# Patient Record
Sex: Female | Born: 1985 | Race: Black or African American | Hispanic: No | Marital: Single | Smoking: Never smoker
Health system: Southern US, Community
[De-identification: ages and names within clinical notes are randomized; demographics above are authoritative.]

## PROBLEM LIST (undated history)

## (undated) DIAGNOSIS — I10 Essential (primary) hypertension: Secondary | ICD-10-CM

---

## 2005-10-23 ENCOUNTER — Emergency Department (HOSPITAL_COMMUNITY): Admission: EM | Admit: 2005-10-23 | Discharge: 2005-10-23 | Payer: Self-pay | Admitting: Emergency Medicine

## 2005-10-28 ENCOUNTER — Emergency Department (HOSPITAL_COMMUNITY): Admission: EM | Admit: 2005-10-28 | Discharge: 2005-10-28 | Payer: Self-pay | Admitting: Emergency Medicine

## 2011-06-17 ENCOUNTER — Emergency Department (HOSPITAL_COMMUNITY)
Admission: EM | Admit: 2011-06-17 | Discharge: 2011-06-17 | Disposition: A | Discharge status: Home / Self Care | Payer: PRIVATE HEALTH INSURANCE | Source: Home / Self Care | Attending: Emergency Medicine | Admitting: Emergency Medicine

## 2011-06-17 DIAGNOSIS — R05 Cough: Secondary | ICD-10-CM

## 2011-06-17 HISTORY — DX: Essential (primary) hypertension: I10

## 2011-06-17 MED ORDER — GUAIFENESIN-CODEINE 100-10 MG/5ML PO SYRP: 5.0000 mL | ORAL_SOLUTION | Freq: Four times a day (QID) | ORAL | Status: AC | PRN

## 2011-06-17 MED ORDER — ALBUTEROL SULFATE HFA 108 (90 BASE) MCG/ACT IN AERS: 1.0000 | INHALATION_SPRAY | Freq: Four times a day (QID) | RESPIRATORY_TRACT | Status: DC | PRN

## 2011-06-17 MED ORDER — IBUPROFEN 600 MG PO TABS: 600.0000 mg | ORAL_TABLET | Freq: Four times a day (QID) | ORAL | Status: AC | PRN

## 2011-06-17 NOTE — ED Provider Notes (Signed)
History     CSN: [id number] Arrival date & time: 06/17/2011 10:15 AM   First MD Initiated Contact with Patient 06/17/11 1006      Chief Complaint  Patient presents with  . Cough    HPI Comments: Pt with dry nonproductive cough with wheezing. Achy CP after coughing. Unable to sleep at night secondary to coughing. Pt's children with similar sx last week. No other URI like sx. No fevers, SOB, abd pain, post tussive emesis.   Patient is a 25 y.o. female presenting with cough. The history is provided by the patient.  Cough This is a new problem. The current episode started more than 2 days ago. The problem has not changed since onset.The cough is non-productive. There has been no fever. Associated symptoms include wheezing. Pertinent negatives include no chest pain, no chills, no sweats, no weight loss, no headaches, no rhinorrhea, no sore throat, no myalgias and no shortness of breath. She has tried cough syrup for the symptoms. The treatment provided no relief. She is not a smoker. Her past medical history does not include bronchitis, pneumonia or asthma.    Past Medical History  Diagnosis Date  . Hypertension     History reviewed. No pertinent past surgical history.  History reviewed. No pertinent family history.  History  Substance Use Topics  . Smoking status: Never Smoker   . Smokeless tobacco: Not on file  . Alcohol Use: No    OB History    Grav Para Term Preterm Abortions TAB SAB Ect Mult Living                  Review of Systems  Constitutional: Negative for fever, chills and weight loss.  HENT: Negative for sore throat, rhinorrhea and sneezing.   Respiratory: Positive for cough and wheezing. Negative for shortness of breath.   Cardiovascular: Negative for chest pain.  Gastrointestinal: Negative for nausea and vomiting.  Musculoskeletal: Negative for myalgias.  Neurological: Negative for weakness and headaches.  Psychiatric/Behavioral: The patient is hyperactive.      Allergies  Review of patient's allergies indicates no known allergies.  Home Medications   Current Outpatient Rx  Name Route Sig Dispense Refill  . ALBUTEROL SULFATE HFA 108 (90 BASE) MCG/ACT IN AERS Inhalation Inhale 1-2 puffs into the lungs every 6 (six) hours as needed for wheezing. 1 Inhaler 0  . GUAIFENESIN-CODEINE 100-10 MG/5ML PO SYRP Oral Take 5 mLs by mouth 4 (four) times daily as needed for cough. 120 mL 0  . IBUPROFEN 600 MG PO TABS Oral Take 1 tablet (600 mg total) by mouth every 6 (six) hours as needed for pain. 30 tablet 0    BP 144/110  Pulse 86  Temp(Src) 98.1 F (36.7 C) (Oral)  Resp 12  SpO2 100%  LMP 06/03/2011  Physical Exam  Nursing note and vitals reviewed. Constitutional: She is oriented to person, place, and time. She appears well-developed and well-nourished.  HENT:  Head: Normocephalic and atraumatic.  Right Ear: Tympanic membrane, external ear and ear canal normal.  Left Ear: Tympanic membrane, external ear and ear canal normal.  Mouth/Throat: Oropharynx is clear and moist and mucous membranes are normal. She has dentures.  Eyes: Conjunctivae and EOM are normal. Pupils are equal, round, and reactive to light.  Neck: Normal range of motion.  Cardiovascular: Normal rate, regular rhythm and normal heart sounds.   Pulmonary/Chest: Effort normal and breath sounds normal. No respiratory distress. She has no wheezes. She has no rales.  Abdominal:  Soft. Bowel sounds are normal. She exhibits no distension. There is no tenderness. There is no rebound and no guarding.  Musculoskeletal: Normal range of motion. She exhibits no edema and no tenderness.  Lymphadenopathy:    She has no cervical adenopathy.  Neurological: She is alert and oriented to person, place, and time.  Skin: Skin is warm and dry. No rash noted.  Psychiatric: She has a normal mood and affect. Her behavior is normal. Judgment and thought content normal.    ED Course  Procedures  (including critical care time)  Labs Reviewed - No data to display No results found.   1. Cough       MDM  Pt hypertensive today. No prev records of BP in system. States BP always this high when she "goes to the doctor's office." . Pt denies any CNS type sx such as HA, visual changes, focal paresis, or new onset seizure activity. Pt denies any CV sx such as CP, dyspnea, palpitations, pedal edema, tearing pain radiating to back or abd. Pt denied any renal sx such as anuria or hematuria. Pt denies illicit drug use, most notably cocaine, or recent use of OTC medications such as nasal decongestants.last cold medication was several days ago.  Discussed importance of lifestyle modifications as important first steps, and may need medication in future.  Pt to f/u as OP with PMD of choice        Luiz Blare, MD 06/17/11 1109

## 2011-06-17 NOTE — ED Notes (Signed)
C/o dry cough for 1 week, some dizziness; children recently ill w similar syx; using OTC cough meds w minmal relief

## 2011-06-20 ENCOUNTER — Emergency Department (HOSPITAL_COMMUNITY)
Admission: EM | Admit: 2011-06-20 | Discharge: 2011-06-20 | Disposition: A | Discharge status: Home / Self Care | Payer: PRIVATE HEALTH INSURANCE

## 2011-12-16 ENCOUNTER — Encounter: Payer: Self-pay | Admitting: Gynecology

## 2011-12-16 ENCOUNTER — Ambulatory Visit (INDEPENDENT_AMBULATORY_CARE_PROVIDER_SITE_OTHER): Payer: 59 | Admitting: Gynecology

## 2011-12-16 ENCOUNTER — Other Ambulatory Visit (HOSPITAL_COMMUNITY)
Admission: RE | Admit: 2011-12-16 | Discharge: 2011-12-16 | Disposition: A | Discharge status: Home / Self Care | Payer: 59 | Source: Ambulatory Visit | Attending: Gynecology | Admitting: Gynecology

## 2011-12-16 VITALS — BP 140/90 | Ht 59.5 in | Wt 114.0 lb

## 2011-12-16 DIAGNOSIS — Z8249 Family history of ischemic heart disease and other diseases of the circulatory system: Secondary | ICD-10-CM

## 2011-12-16 DIAGNOSIS — I10 Essential (primary) hypertension: Secondary | ICD-10-CM

## 2011-12-16 DIAGNOSIS — Z113 Encounter for screening for infections with a predominantly sexual mode of transmission: Secondary | ICD-10-CM

## 2011-12-16 DIAGNOSIS — Z01419 Encounter for gynecological examination (general) (routine) without abnormal findings: Secondary | ICD-10-CM | POA: Insufficient documentation

## 2011-12-16 LAB — CBC WITH DIFFERENTIAL/PLATELET
Basophils Absolute: 0 10*3/uL (ref 0.0–0.1)
Basophils Relative: 0 % (ref 0–1)
HCT: 37.1 % (ref 36.0–46.0)
Lymphocytes Relative: 39 % (ref 12–46)
MCHC: 34 g/dL (ref 30.0–36.0)
Monocytes Absolute: 0.5 10*3/uL (ref 0.1–1.0)
Neutro Abs: 2.6 10*3/uL (ref 1.7–7.7)
Neutrophils Relative %: 49 % (ref 43–77)
Platelets: 311 10*3/uL (ref 150–400)
RDW: 13.5 % (ref 11.5–15.5)
WBC: 5.2 10*3/uL (ref 4.0–10.5)

## 2011-12-16 LAB — CHOLESTEROL, TOTAL: Cholesterol: 131 mg/dL (ref 0–200)

## 2011-12-16 LAB — COMPREHENSIVE METABOLIC PANEL
Albumin: 4.3 g/dL (ref 3.5–5.2)
Alkaline Phosphatase: 63 U/L (ref 39–117)
BUN: 12 mg/dL (ref 6–23)
Calcium: 9.3 mg/dL (ref 8.4–10.5)
Chloride: 103 mEq/L (ref 96–112)
Glucose, Bld: 84 mg/dL (ref 70–99)
Potassium: 3.8 mEq/L (ref 3.5–5.3)
Sodium: 136 mEq/L (ref 135–145)
Total Protein: 7.2 g/dL (ref 6.0–8.3)

## 2011-12-16 NOTE — Patient Instructions (Signed)
Health Maintenance, Females A healthy lifestyle and preventative care can promote health and wellness.  Maintain regular health, dental, and eye exams.   Eat a healthy diet. Foods like vegetables, fruits, whole grains, low-fat dairy products, and lean protein foods contain the nutrients you need without too many calories. Decrease your intake of foods high in solid fats, added sugars, and salt. Get information about a proper diet from your caregiver, if necessary.   Regular physical exercise is one of the most important things you can do for your health. Most adults should get at least 150 minutes of moderate-intensity exercise (any activity that increases your heart rate and causes you to sweat) each week. In addition, most adults need muscle-strengthening exercises on 2 or more days a week.    Maintain a healthy weight. The body mass index (BMI) is a screening tool to identify possible weight problems. It provides an estimate of body fat based on height and weight. Your caregiver can help determine your BMI, and can help you achieve or maintain a healthy weight. For adults 20 years and older:   A BMI below 18.5 is considered underweight.   A BMI of 18.5 to 24.9 is normal.   A BMI of 25 to 29.9 is considered overweight.   A BMI of 30 and above is considered obese.   Maintain normal blood lipids and cholesterol by exercising and minimizing your intake of saturated fat. Eat a balanced diet with plenty of fruits and vegetables. Blood tests for lipids and cholesterol should begin at age 20 and be repeated every 5 years. If your lipid or cholesterol levels are high, you are over 50, or you are a high risk for heart disease, you may need your cholesterol levels checked more frequently.Ongoing high lipid and cholesterol levels should be treated with medicines if diet and exercise are not effective.   If you smoke, find out from your caregiver how to quit. If you do not use tobacco, do not start.    If you are pregnant, do not drink alcohol. If you are breastfeeding, be very cautious about drinking alcohol. If you are not pregnant and choose to drink alcohol, do not exceed 1 drink per day. One drink is considered to be 12 ounces (355 mL) of beer, 5 ounces (148 mL) of wine, or 1.5 ounces (44 mL) of liquor.   Avoid use of street drugs. Do not share needles with anyone. Ask for help if you need support or instructions about stopping the use of drugs.   High blood pressure causes heart disease and increases the risk of stroke. Blood pressure should be checked at least every 1 to 2 years. Ongoing high blood pressure should be treated with medicines, if weight loss and exercise are not effective.   If you are 55 to 27 years old, ask your caregiver if you should take aspirin to prevent strokes.   Diabetes screening involves taking a blood sample to check your fasting blood sugar level. This should be done once every 3 years, after age 45, if you are within normal weight and without risk factors for diabetes. Testing should be considered at a younger age or be carried out more frequently if you are overweight and have at least 1 risk factor for diabetes.   Breast cancer screening is essential preventative care for women. You should practice "breast self-awareness." This means understanding the normal appearance and feel of your breasts and may include breast self-examination. Any changes detected, no matter how   small, should be reported to a caregiver. Women in their 20s and 30s should have a clinical breast exam (CBE) by a caregiver as part of a regular health exam every 1 to 3 years. After age 40, women should have a CBE every year. Starting at age 40, women should consider having a mammogram (breast X-ray) every year. Women who have a family history of breast cancer should talk to their caregiver about genetic screening. Women at a high risk of breast cancer should talk to their caregiver about having  an MRI and a mammogram every year.   The Pap test is a screening test for cervical cancer. Women should have a Pap test starting at age 21. Between ages 21 and 29, Pap tests should be repeated every 2 years. Beginning at age 30, you should have a Pap test every 3 years as long as the past 3 Pap tests have been normal. If you had a hysterectomy for a problem that was not cancer or a condition that could lead to cancer, then you no longer need Pap tests. If you are between ages 65 and 70, and you have had normal Pap tests going back 10 years, you no longer need Pap tests. If you have had past treatment for cervical cancer or a condition that could lead to cancer, you need Pap tests and screening for cancer for at least 20 years after your treatment. If Pap tests have been discontinued, risk factors (such as a new sexual partner) need to be reassessed to determine if screening should be resumed. Some women have medical problems that increase the chance of getting cervical cancer. In these cases, your caregiver may recommend more frequent screening and Pap tests.   The human papillomavirus (HPV) test is an additional test that may be used for cervical cancer screening. The HPV test looks for the virus that can cause the cell changes on the cervix. The cells collected during the Pap test can be tested for HPV. The HPV test could be used to screen women aged 30 years and older, and should be used in women of any age who have unclear Pap test results. After the age of 30, women should have HPV testing at the same frequency as a Pap test.   Colorectal cancer can be detected and often prevented. Most routine colorectal cancer screening begins at the age of 50 and continues through age 75. However, your caregiver may recommend screening at an earlier age if you have risk factors for colon cancer. On a yearly basis, your caregiver may provide home test kits to check for hidden blood in the stool. Use of a small camera at  the end of a tube, to directly examine the colon (sigmoidoscopy or colonoscopy), can detect the earliest forms of colorectal cancer. Talk to your caregiver about this at age 50, when routine screening begins. Direct examination of the colon should be repeated every 5 to 10 years through age 75, unless early forms of pre-cancerous polyps or small growths are found.   Hepatitis C blood testing is recommended for all people born from 1945 through 1965 and any individual with known risks for hepatitis C.   Practice safe sex. Use condoms and avoid high-risk sexual practices to reduce the spread of sexually transmitted infections (STIs). Sexually active women aged 25 and younger should be checked for Chlamydia, which is a common sexually transmitted infection. Older women with new or multiple partners should also be tested for Chlamydia. Testing for other   STIs is recommended if you are sexually active and at increased risk.   Osteoporosis is a disease in which the bones lose minerals and strength with aging. This can result in serious bone fractures. The risk of osteoporosis can be identified using a bone density scan. Women ages 65 and over and women at risk for fractures or osteoporosis should discuss screening with their caregivers. Ask your caregiver whether you should be taking a calcium supplement or vitamin D to reduce the rate of osteoporosis.   Menopause can be associated with physical symptoms and risks. Hormone replacement therapy is available to decrease symptoms and risks. You should talk to your caregiver about whether hormone replacement therapy is right for you.   Use sunscreen with a sun protection factor (SPF) of 30 or greater. Apply sunscreen liberally and repeatedly throughout the day. You should seek shade when your shadow is shorter than you. Protect yourself by wearing long sleeves, pants, a wide-brimmed hat, and sunglasses year round, whenever you are outdoors.   Notify your caregiver  of new moles or changes in moles, especially if there is a change in shape or color. Also notify your caregiver if a mole is larger than the size of a pencil eraser.   Stay current with your immunizations.  Document Released: 01/31/2011 Document Revised: 07/07/2011 Document Reviewed: 01/31/2011 [employer] Patient Information 2012 [employer].  Breast Self-Exam A self breast exam may help you find changes or problems while they are still small. Do a breast self-exam:  Every month.   One week after your period (menstrual period).   On the first day of each month if you do not have periods anymore.  Look for any:  Change in breast color, size, or shape.   Dimples in your breast.   Changes in your nipples or skin.   Dry skin on your breasts or nipples.   Watery or bloody discharge from your nipples.   Feel for:  Lumps.   Thick, hard places.   Any other changes.  HOME CARE There are 3 ways to do the breast self-exam: In front of a mirror.  Lift your arms over your head and turn side to side.   Put your hands on your hips and lean down, then turn from side to side.   Bend forward and turn from side to side.  In the shower.  With soapy hands, check both breasts. Then check above and below your collarbone and your armpits.   Feel above and below your collarbone down to under your breast, and from the center of your chest to the outer edge of the armpit. Check for any lumps or hard spots.   Using the tips of your middle three fingers check your whole breast by pressing your hand over your breast in a circle or in an up and down motion.  Lying down.  Lie flat on your bed.   Put a small pillow under the breast you are going to check. On that same side, put your hand behind your head.   With your other hand, use the 3 middle fingers to feel the breast.   Move your fingers in a circle around the breast. Press firmly over all parts of the breast to feel for any lumps.    GET HELP RIGHT AWAY IF: You find any changes in your breasts so they can be checked. Document Released: 01/04/2008 Document Revised: 07/07/2011 Document Reviewed: 11/05/2008 [employer] Patient Information 2012 [employer]. 

## 2011-12-16 NOTE — Progress Notes (Signed)
Melanie Compton 1985/10/09 [id number]   History:    26 y.o.  for annual exam new patient to the practice who had her gynecological examination in another practice in 2012. She is using condoms for contraception. She reports normal menstrual cycles. Patient is sexually active. No prior history of abnormal Pap smears or PID or any STDs.  Past medical history,surgical history, family history and social history were all reviewed and documented in the EPIC chart.  Gynecologic History Patient's last menstrual period was 11/27/2011. Contraception: condoms Last Pap: 2012. Results were: normal Last mammogram: Not indicated. Results were: Not indicated  Obstetric History OB History    Grav Para Term Preterm Abortions TAB SAB Ect Mult Living   0                ROS: A ROS was performed and pertinent positives and negatives are included in the history.  GENERAL: No fevers or chills. HEENT: No change in vision, no earache, sore throat or sinus congestion. NECK: No pain or stiffness. CARDIOVASCULAR: No chest pain or pressure. No palpitations. PULMONARY: No shortness of breath, cough or wheeze. GASTROINTESTINAL: No abdominal pain, nausea, vomiting or diarrhea, melena or bright red blood per rectum. GENITOURINARY: No urinary frequency, urgency, hesitancy or dysuria. MUSCULOSKELETAL: No joint or muscle pain, no back pain, no recent trauma. DERMATOLOGIC: No rash, no itching, no lesions. ENDOCRINE: No polyuria, polydipsia, no heat or cold intolerance. No recent change in weight. HEMATOLOGICAL: No anemia or easy bruising or bleeding. NEUROLOGIC: No headache, seizures, numbness, tingling or weakness. PSYCHIATRIC: No depression, no loss of interest in normal activity or change in sleep pattern.     Exam: chaperone present  BP 140/90  Ht 4' 11.5" (1.511 m)  Wt 114 lb (51.71 kg)  BMI 22.64 kg/m2  LMP 11/27/2011  Body mass index is 22.64 kg/(m^2).  General appearance : Well developed well nourished female.  No acute distress HEENT: Neck supple, trachea midline, no carotid bruits, no thyroidmegaly Lungs: Clear to auscultation, no rhonchi or wheezes, or rib retractions  Heart: Regular rate and rhythm, no murmurs or gallops Breast:Examined in sitting and supine position were symmetrical in appearance, no palpable masses or tenderness,  no skin retraction, no nipple inversion, no nipple discharge, no skin discoloration, no axillary or supraclavicular lymphadenopathy Abdomen: no palpable masses or tenderness, no rebound or guarding Extremities: no edema or skin discoloration or tenderness  Pelvic:  Bartholin, Urethra, Skene Glands: Within normal limits             Vagina: No gross lesions or discharge  Cervix: No gross lesions or discharge  Uterus  anteverted, normal size, shape and consistency, non-tender and mobile  Adnexa  Without masses or tenderness  Anus and perineum  normal   Rectovaginal  normal sphincter tone without palpated masses or tenderness             Hemoccult not done     Assessment/Plan:  26 y.o. female for annual exam who was given instructions on self breast examination and literature information was provided. We discussed different contraceptive options for her and she is interested in nonhormonal form of contraception so literature information on the ParaGard T380A was provided. We'll also give her literature information on the Gardasil Vaccine. Patient with strong family history of hypertension. When patient came in to the office her blood pressure was 140/92 and on repeat was 120/84. With her strong family history of hypertension have asked her to monitor her blood pressures at work. The  following labs were drawn today: CBC, screening cholesterol, urinalysis, comprehensive metabolic panel (as a result of her borderline hypertension) and also Pap smear. New Pap smear screening guidelines discussed. Literature information on self breast examination was provided.   Ok Edwards MD, 12:42 PM 12/16/2011

## 2011-12-17 LAB — URINALYSIS W MICROSCOPIC + REFLEX CULTURE
Hgb urine dipstick: NEGATIVE
Leukocytes, UA: NEGATIVE
Nitrite: NEGATIVE
Protein, ur: NEGATIVE mg/dL
Urobilinogen, UA: 0.2 mg/dL (ref 0.0–1.0)

## 2011-12-17 LAB — GC/CHLAMYDIA PROBE AMP, GENITAL
Chlamydia, DNA Probe: NEGATIVE
GC Probe Amp, Genital: NEGATIVE

## 2012-02-20 ENCOUNTER — Emergency Department (HOSPITAL_COMMUNITY): Payer: 59

## 2012-02-20 ENCOUNTER — Emergency Department (HOSPITAL_COMMUNITY)
Admission: EM | Admit: 2012-02-20 | Discharge: 2012-02-20 | Disposition: A | Discharge status: Home / Self Care | Payer: 59 | Attending: Emergency Medicine | Admitting: Emergency Medicine

## 2012-02-20 ENCOUNTER — Encounter (HOSPITAL_COMMUNITY): Payer: Self-pay | Admitting: *Deleted

## 2012-02-20 DIAGNOSIS — R0602 Shortness of breath: Secondary | ICD-10-CM | POA: Insufficient documentation

## 2012-02-20 DIAGNOSIS — R079 Chest pain, unspecified: Secondary | ICD-10-CM | POA: Insufficient documentation

## 2012-02-20 DIAGNOSIS — I10 Essential (primary) hypertension: Secondary | ICD-10-CM | POA: Insufficient documentation

## 2012-02-20 LAB — COMPREHENSIVE METABOLIC PANEL
ALT: 6 U/L (ref 0–35)
AST: 14 U/L (ref 0–37)
Albumin: 3.7 g/dL (ref 3.5–5.2)
Calcium: 9.5 mg/dL (ref 8.4–10.5)
Creatinine, Ser: 0.73 mg/dL (ref 0.50–1.10)
GFR calc non Af Amer: >90 mL/min (ref >90)
Sodium: 139 mEq/L (ref 135–145)
Total Protein: 7.2 g/dL (ref 6.0–8.3)

## 2012-02-20 LAB — CBC WITH DIFFERENTIAL/PLATELET
Basophils Absolute: 0 10*3/uL (ref 0.0–0.1)
Basophils Relative: 0 % (ref 0–1)
Eosinophils Absolute: 0.1 10*3/uL (ref 0.0–0.7)
Eosinophils Relative: 1 % (ref 0–5)
HCT: 35.2 % — ABNORMAL LOW (ref 36.0–46.0)
MCHC: 34.9 g/dL (ref 30.0–36.0)
MCV: 89.1 fL (ref 78.0–100.0)
Monocytes Absolute: 0.6 10*3/uL (ref 0.1–1.0)
Platelets: 277 10*3/uL (ref 150–400)
RDW: 12.7 % (ref 11.5–15.5)
WBC: 7.9 10*3/uL (ref 4.0–10.5)

## 2012-02-20 LAB — URINALYSIS, ROUTINE W REFLEX MICROSCOPIC
Bilirubin Urine: NEGATIVE
Hgb urine dipstick: NEGATIVE
Nitrite: NEGATIVE
Protein, ur: NEGATIVE mg/dL
Specific Gravity, Urine: 1.017 (ref 1.005–1.030)
Urobilinogen, UA: 0.2 mg/dL (ref 0.0–1.0)

## 2012-02-20 LAB — PREGNANCY, URINE: Preg Test, Ur: NEGATIVE

## 2012-02-20 MED ORDER — IBUPROFEN 600 MG PO TABS: 600.0000 mg | ORAL_TABLET | Freq: Four times a day (QID) | ORAL | Status: AC | PRN

## 2012-02-20 NOTE — ED Notes (Signed)
Advised of the wait time 

## 2012-02-20 NOTE — ED Provider Notes (Signed)
History     CSN: [id number]  Arrival date & time 02/20/12  1715   First MD Initiated Contact with Patient 02/20/12 2047      Chief Complaint  Patient presents with  . Chest Pain    (Consider location/radiation/quality/duration/timing/severity/associated sxs/prior treatment) Patient is a 26 y.o. female presenting with chest pain.  Chest Pain Pertinent negatives for primary symptoms include no shortness of breath, no abdominal pain, no nausea and no vomiting.  Pertinent negatives for associated symptoms include no numbness and no weakness.    patient's had sharp left-sided chest pain on-and-off for the last week. It is worse with movement or palpation. She's had mild episodes of trouble breathing when the pain comes on. No trauma. No fevers. No cough. She does not smoke or use birth control pills. She does not have the early family history of cardiac disease. No tachycardia. She's not had pains like this before. No recent travel.  Past Medical History  Diagnosis Date  . Hypertension     History reviewed. No pertinent past surgical history.  Family History  Problem Relation Age of Onset  . Hypertension Mother   . Hypertension Maternal Grandmother     History  Substance Use Topics  . Smoking status: Never Smoker   . Smokeless tobacco: Never Used  . Alcohol Use: No    OB History    Grav Para Term Preterm Abortions TAB SAB Ect Mult Living   0               Review of Systems  Constitutional: Negative for activity change and appetite change.  HENT: Negative for neck stiffness.   Eyes: Negative for pain.  Respiratory: Negative for chest tightness and shortness of breath.   Cardiovascular: Positive for chest pain. Negative for leg swelling.  Gastrointestinal: Negative for nausea, vomiting, abdominal pain and diarrhea.  Genitourinary: Negative for flank pain.  Musculoskeletal: Negative for back pain.  Skin: Negative for rash.  Neurological: Negative for weakness,  numbness and headaches.  Psychiatric/Behavioral: Negative for behavioral problems.    Allergies  Codeine  Home Medications   Current Outpatient Rx  Name Route Sig Dispense Refill  . IBUPROFEN 600 MG PO TABS Oral Take 1 tablet (600 mg total) by mouth every 6 (six) hours as needed for pain. 10 tablet 0    BP 151/105  Pulse 76  Temp 100.2 F (37.9 C) (Oral)  Resp 18  SpO2 100%  LMP 01/25/2012  Physical Exam  Nursing note and vitals reviewed. Constitutional: She is oriented to person, place, and time. She appears well-developed and well-nourished.  HENT:  Head: Normocephalic and atraumatic.  Eyes: EOM are normal. Pupils are equal, round, and reactive to light.  Neck: Normal range of motion. Neck supple.  Cardiovascular: Normal rate, regular rhythm and normal heart sounds.   No murmur heard. Pulmonary/Chest: Effort normal and breath sounds normal. No respiratory distress. She has no wheezes. She has no rales.  Abdominal: Soft. Bowel sounds are normal. She exhibits no distension. There is tenderness. There is no rebound and no guarding.       Tenderness left anterior chest wall.  Musculoskeletal: Normal range of motion.  Neurological: She is alert and oriented to person, place, and time. No cranial nerve deficit.  Skin: Skin is warm and dry.  Psychiatric: She has a normal mood and affect. Her speech is normal.    ED Course  Procedures (including critical care time)  Labs Reviewed  URINALYSIS, ROUTINE W REFLEX MICROSCOPIC -  Abnormal; Notable for the following:    APPearance HAZY (*)     All other components within normal limits  CBC WITH DIFFERENTIAL - Abnormal; Notable for the following:    HCT 35.2 (*)     All other components within normal limits  COMPREHENSIVE METABOLIC PANEL - Abnormal; Notable for the following:    Total Bilirubin 0.2 (*)     All other components within normal limits  PREGNANCY, URINE  TROPONIN I   Dg Chest 2 View  02/20/2012  *RADIOLOGY  REPORT*  Clinical Data: Chest pain and shortness of breath.  CHEST - 2 VIEW  Comparison: None.  Findings: Cardiac and mediastinal contours appear normal.  The lungs appear clear.  No pleural effusion is identified.  IMPRESSION:  No significant abnormality identified.  Original Report Authenticated By: Dellia Cloud, M.D.     1. Chest pain     Date: 02/20/2012  Rate:79  Rhythm: normal sinus rhythm  QRS Axis: normal  Intervals: normal  ST/T Wave abnormalities: normal  Conduction Disutrbances:none  Narrative Interpretation:   Old EKG Reviewed: none available     MDM  Patient was sharp left-sided shortness of breath. The pain will come and go. Is worse with palpation or movement. EKG is normal. Lab work is reassuring. She does not have risk factors for pulmonary embolism. He does not smoke and does not use birth control pills. She is not tachycardic.   she will be discharged home  Juliet Rude. Rubin Payor, MD 02/20/12 2106

## 2012-02-20 NOTE — ED Notes (Signed)
The p has had lt shoulder and chest pain for one week intermittently.   Constant since 1100am today.  lmp June 26th

## 2013-08-02 ENCOUNTER — Emergency Department (HOSPITAL_COMMUNITY)
Admission: EM | Admit: 2013-08-02 | Discharge: 2013-08-02 | Disposition: A | Discharge status: Home / Self Care | Payer: No Typology Code available for payment source | Attending: Emergency Medicine | Admitting: Emergency Medicine

## 2013-08-02 ENCOUNTER — Emergency Department (HOSPITAL_COMMUNITY): Payer: No Typology Code available for payment source

## 2013-08-02 ENCOUNTER — Encounter (HOSPITAL_COMMUNITY): Payer: Self-pay | Admitting: Emergency Medicine

## 2013-08-02 DIAGNOSIS — H6120 Impacted cerumen, unspecified ear: Secondary | ICD-10-CM | POA: Insufficient documentation

## 2013-08-02 DIAGNOSIS — R0602 Shortness of breath: Secondary | ICD-10-CM | POA: Insufficient documentation

## 2013-08-02 DIAGNOSIS — R05 Cough: Secondary | ICD-10-CM

## 2013-08-02 DIAGNOSIS — R059 Cough, unspecified: Secondary | ICD-10-CM

## 2013-08-02 DIAGNOSIS — R079 Chest pain, unspecified: Secondary | ICD-10-CM | POA: Insufficient documentation

## 2013-08-02 DIAGNOSIS — I10 Essential (primary) hypertension: Secondary | ICD-10-CM | POA: Insufficient documentation

## 2013-08-02 DIAGNOSIS — J069 Acute upper respiratory infection, unspecified: Secondary | ICD-10-CM | POA: Insufficient documentation

## 2013-08-02 MED ORDER — GUAIFENESIN 100 MG/5ML PO LIQD
100.0000 mg | ORAL | Status: DC | PRN
Start: 2013-08-02 — End: 2020-12-31

## 2013-08-02 NOTE — Discharge Instructions (Signed)
Please call and set-up an appointment with Urgent care center to be re-assessed  Please rest and stay hydrated Please take cough syrup as prescribed to aid in cough relief Please avoid any physical or strenuous activity Please continue to monitor symptoms and if symptoms are to worsen or change (fever greater than 101, chills, nausea, vomiting, chest pain, shortness of breath, difficulty breathing, throat closing sensation, sore throat, difficulty swallowing, worsening symptoms, coughing up blood) please report back to emergency department immediately  Upper Respiratory Infection, Adult An upper respiratory infection (URI) is also known as the common cold. It is often caused by a type of germ (virus). Colds are easily spread (contagious). You can pass it to others by kissing, coughing, sneezing, or drinking out of the same glass. Usually, you get better in 1 or 2 weeks.  HOME CARE   Only take medicine as told by your doctor.  Use a warm mist humidifier or breathe in steam from a hot shower.  Drink enough water and fluids to keep your pee (urine) clear or pale yellow.  Get plenty of rest.  Return to work when your temperature is back to normal or as told by your doctor. You may use a face mask and wash your hands to stop your cold from spreading. GET HELP RIGHT AWAY IF:   After the first few days, you feel you are getting worse.  You have questions about your medicine.  You have chills, shortness of breath, or brown or red spit (mucus).  You have yellow or brown snot (nasal discharge) or pain in the face, especially when you bend forward.  You have a fever, puffy (swollen) neck, pain when you swallow, or white spots in the back of your throat.  You have a bad headache, ear pain, sinus pain, or chest pain.  You have a high-pitched whistling sound when you breathe in and out (wheezing).  You have a lasting cough or cough up blood.  You have sore muscles or a stiff neck. MAKE SURE  YOU:   Understand these instructions.  Will watch your condition.  Will get help right away if you are not doing well or get worse. Document Released: 01/04/2008 Document Revised: 10/10/2011 Document Reviewed: 11/22/2010 [facility] Patient Information 2014 [place], [place].   Emergency Department Resource Guide 1) Find a Doctor and Pay Out of Pocket Although you won't have to find out who is covered by your insurance plan, it is a good idea to ask around and get recommendations. You will then need to call the office and see if the doctor you have chosen will accept you as a new patient and what types of options they offer for patients who are self-pay. Some doctors offer discounts or will set up payment plans for their patients who do not have insurance, but you will need to ask so you aren't surprised when you get to your appointment.  2) Contact Your Local Health Department Not all health departments have doctors that can see patients for sick visits, but many do, so it is worth a call to see if yours does. If you don't know where your local health department is, you can check in your phone book. The CDC also has a tool to help you locate your state's health department, and many state websites also have listings of all of their local health departments.  3) Find a Walk-in Clinic If your illness is not likely to be very severe or complicated, you may want to try  a walk in clinic. These are popping up all over the country in pharmacies, drugstores, and shopping centers. They're usually staffed by nurse practitioners or physician assistants that have been trained to treat common illnesses and complaints. They're usually fairly quick and inexpensive. However, if you have serious medical issues or chronic medical problems, these are probably not your best option.  No Primary Care Doctor: - Call Health Connect at  [phone] - they can help you locate a primary care doctor that  accepts your insurance,  provides certain services, etc. - Physician Referral Service- [phone]  Chronic Pain Problems: Organization         Address  Phone   Notes  [facility]  [phone]) [phone] Patients need to be referred by their primary care doctor.   Medication Assistance: Organization         Address  Phone   Notes  [facility] Medication [facility] [address] [place], [place] [address] [phone]) [phone] --Must be a resident of [facility] [place] -- Must have NO insurance coverage whatsoever (no Medicaid/ Medicare, etc.) -- The pt. MUST have a primary care doctor that directs their care regularly and follows them in the community   [facility]ist  [phone]) [phone]   [employer]  [phone]    Agencies that provide inexpensive medical care: Organization         Address  Phone   Notes  [facility]  [phone]) [phone]   [facility]    [phone]   [facility] [address] [place], [place] [address] [phone]) [phone]   [facility] [address], [place] [phone]   [facility]    [phone](336) [phone]   [facility]    [phone]   [facility]  [address], [place] Phone:  [phone], Fax:  [phone] Hours of Operation:  9 am - 6 pm, M-F.  Also accepts Medicaid/Medicare and self-pay.  [facility]  [address], [place] Phone: [phone], Fax: [phone]) [phone]. Hours of Operation:  8:30 am - 5:30 pm, M-F.  Also accepts Medicaid and self-pay.  [facility] [place] [address], [place] Phone: [phone]   [facility] [address], [place], [place] [phone], Ext. [phone] Mondays & Thursdays: 7-9 AM.  First 15 patients are seen on a first come, first serve basis.    Medicaid-accepting [facility] Providers:  Organization         Address  Phone    Notes  [facility] [address], [place] [phone] Also accepts self-pay patients.  [facility] [address], [place]  [phone]   [facility] [address], [place] [phone]   [facility] [address] [place] [address], [place] [phone]   Renaye RakersVeita Bland [address], [place]   [phone] Only accepts [place] Access [place] patients after they have their name applied to their card.   Self-Pay (no insurance) in [facility]:  Organization         Address  Phone   Notes  Sickle Cell Patients, [facility] [address], [place] [phone]   [facility] [address], [place] [phone]  [facility]  [address], [place] [phone]   [facility]/Dr. Osei-Bonsu  [address] [place] [address], [place] or [address], [place] [phone] Phone number for both [place] and [place] locations is the same.  [facility] [address], [place] [phone]   [facility] [place] [address] [place] [address], [place] or [address] [phone]) [phone]   [facility] [address], [place] [phone], phone; [phone]) [phone], fax Sees patients 1st and 3rd Saturday of every month.  Must not qualify for public or private insurance (i.e. Medicaid, Medicare, [place] Health Choice, Veterans' Benefits)  Household income should be no more than 200% of the poverty level The clinic cannot treat you if you are pregnant or think you are pregnant  Sexually transmitted diseases are not treated at the clinic.    Dental Care: Organization         Address  Phone  Notes  [facility] [address], [place] [phone])  [phone] Accepts children up to age 29 who are enrolled in [place] or [place] Health Choice; pregnant women with a Medicaid card; and children who have applied for Medicaid or [place] Health Choice, but were declined, whose parents can pay a reduced fee at time of service.  [facility] [place]  [address], [place] [phone] Accepts children up to age 29 who are enrolled in [place] or [place] Health Choice; pregnant women with a Medicaid card; and children who have applied for Medicaid or [place] Health Choice, but were declined, whose parents can pay a reduced fee at time of service.  [facility]  [address], [place] [phone]) [phone] Patients are seen by appointment only. Walk-ins are not accepted. [facility] will see patients 29 years of age and older. Monday - Tuesday (8am-5pm) Most Wednesdays (8:30-5pm) $30 per visit, cash only  [facility]  [address], [facility] [phone]) [phone] Patients are seen by appointment only. Walk-ins are not accepted. [facility] will see patients 29 years of age and older. One Wednesday Evening (Monthly: Volunteer Based).  $30 per visit, cash only  [facility]  [phone] for adults; Children under age 344, call Graduate Pediatric Dentistry at [phone]. Children aged 29-14, please call [phone] to request a pediatric application.  Dental services are provided in all areas of dental care including fillings, crowns and bridges, complete and partial dentures, implants, gum treatment, root canals, and extractions. Preventive care is also provided. Treatment is provided to both adults and children. Patients are selected via a lottery and there is often a waiting list.   [facility] [address], [place]  [phone] [email or web address]   [facility] [address], [place], [place] [phone], Ext.  123 Second and Fourth Thursday of each month, opens at 6:30 AM; Clinic ends at 9 AM.  Patients are seen on a first-come first-served basis, and a limited number are seen during each clinic.   [facility]  [address], [place], [place] [phone]   Eligibility Requirements You must have lived in [place], [place], or [place] counties for at least the last three months.   You cannot be eligible for state or federal sponsored  healthcare insurance, including Veterans Administration, Medicaid, or Medicare. °  You generally cannot be eligible for healthcare insurance through your employer.  °  How to apply: °Eligibility screenings are held every Tuesday and Wednesday afternoon from 1:00 pm until 4:00 pm. You do not need an appointment for the interview!  °[facility] [address], [place], [place] [phone]   °[place] [facility]  336-342-8273   °[place] [facility]  336-703-3100   ° County [facility] Department  336-570-6415   ° °Behavioral Health Resources in the Community: °Intensive Outpatient Programs °Organization         Address  Phone  Notes  °[place] [facility] [address], [place], [place] 336-878-6098   °[facility] [address], [place], [place] 336-832-9800   °[facility] [address], [place], [place] ° 336-882-2125   °[facility] [address],  °[place], [place] 1-800-853-5163 or 336-641-4981   °Substance Abuse Resources °Organization         Address  Phone  Notes  °[facility]  336-882-2125   °[facility]  336-784-9470   °[facility]  336-285-9073   °[facility]  336-845-3988   °Residential & Outpatient Substance Abuse Program  1-800-659-3381   °Psychological Services °Organization         Address  Phone  Notes  °[facility]  336- 832-9600   °[facility]  336- 378-7881   °[facility] [address], [place] 1-800-853-5163 or 336-641-4981   ° °Mobile Crisis Teams °Organization         Address  Phone  Notes  °[facility], Mobile Crisis Care Unit  1-877-626-1772   °Assertive °Psychotherapeutic Services ° [address] [place], [place] 336-834-9664   °Sharon DeEsch [address] °[place] 336-554-5454   ° °Self-Help/Support Groups °Organization         Address  Phone             Notes  °[facility] [place] - variety of support groups  336- 373-1402 Call for more information  °Narcotics Anonymous (NA), [facility] [address], °[place]  2 meetings at this location  ° °Residential Treatment Programs °Organization         Address  Phone  Notes  °[facility] [address],    °[place]  1-866-801-8205   °[facility] ° [address], [place], [place] 704-293-8524   °[facility] [address], [place] 336-845-3988 Admissions: 8am-3pm M-F  °[facility] [address]    °[place], [place] 336-841-1104   °[facility] [address], [place], [place] 336-379-7146   °[facility] [address]  °[place], [place] 336-285-9073   °[facility] - Intensive Outpatient [address], [place], [place] 336-852-3033   °[facility] [address]  °[place], [place] 1-877-615-2722 or 336-784-9470   °[facility] ([facility]) [address] [place], [place] 336-227-7417 Accepts Medicaid  °[facility] [address]  ° [place] 1-800-659-3381 Substance Abuse/Addiction Treatment  ° °[place] Behavioral Health Resources °Organization         Address  Phone  Notes  °[facility]  (888) 581-9988   °Julie Brannon, PhD [address] [place], [place]   (336) 349-5553 or (  336) 951-0000   °[facility]   [address] °[place], [place]  (336) 349-4454   °[facility] [address], [place], [place] (336) 342-8316 Insurance/Medicaid/sponsorship through [facility]  °[facility] [address]                                    [place], [place] (336) 342-8316 Therapy/tele-psych/case  °[facility] [address]  ° [place], [place] (336) 349-2233    °Dr. Arfeen  (336) 349-4544   °[facility] [place]  [employer] [place] [facility] 1) [address], [place] °2) [address], [place] °3)  [address], [place] (336) 349-3220 °(336) 342-7768 ° °(336) 342-8140   °[place] Child Abuse Hotline (336) 342-1394 or (336) 342-3537 (After Hours)    ° ° ° °

## 2013-08-02 NOTE — ED Notes (Signed)
The pt has been ill for the past 2 weeks with a cold and cough.  No temp the cough persists.  Non-productive.  lmp dec 7th

## 2013-08-02 NOTE — ED Provider Notes (Signed)
CSN: [id number]     Arrival date & time 08/02/13  1617 History  This chart was scribed for Melanie Mutton, PA-C, working with Gerhard Munch, MD, by Ardelia Mems ED Scribe. This patient was seen in room TR10C/TR10C and the patient's care was started at 5:55 PM.   Chief Complaint  Patient presents with  . Cough    The history is provided by the patient. No language interpreter was used.    HPI Comments: Melanie Compton is a 28 y.o. female with a history of HTN who presents to the Emergency Department complaining of a cough over the past 2 weeks. She states that her cough has been mostly dry and non-productive, but occasionally productive of sputum. She denies coughing up blood. She reports associated chest pain with coughing and states that her cough is worst at night. She also reports associated rhinorrhea, nasal congestion and mild SOB. She states that she has been taking Nyquil with minimal relief of symptoms. She states that she has had recent sick contacts at work. She denies any recent long distance travel or other means of immobilization. She states that she is not on birth control. She denies fever, chills, nausea, emesis, facial pressure, sore throat, difficulty swallowing, ear pain, eye pain, neck pain or stiffness or any other symptoms.   PCP- None  Past Medical History  Diagnosis Date  . Hypertension    History reviewed. No pertinent past surgical history. Family History  Problem Relation Age of Onset  . Hypertension Mother   . Hypertension Maternal Grandmother    History  Substance Use Topics  . Smoking status: Never Smoker   . Smokeless tobacco: Never Used  . Alcohol Use: No   OB History   Grav Para Term Preterm Abortions TAB SAB Ect Mult Living   0              Review of Systems  Constitutional: Negative for fever and chills.  HENT: Positive for congestion and rhinorrhea. Negative for ear pain, sinus pressure, sore throat and trouble swallowing.   Eyes:  Negative for pain.  Respiratory: Positive for cough and shortness of breath.   Cardiovascular: Positive for chest pain (only with coughing).  Gastrointestinal: Negative for nausea and vomiting.  Musculoskeletal: Negative for neck pain and neck stiffness.  All other systems reviewed and are negative.   Allergies  Codeine  Home Medications   Current Outpatient Rx  Name  Route  Sig  Dispense  Refill  . Pseudoeph-Doxylamine-DM-APAP (NYQUIL MULTI-SYMPTOM PO)   Oral   Take 15 mLs by mouth at bedtime as needed (cough).         Marland Kitchen guaiFENesin (ROBITUSSIN) 100 MG/5ML liquid   Oral   Take 5-10 mLs (100-200 mg total) by mouth every 4 (four) hours as needed for cough.   60 mL   0     Triage Vitals: BP 155/104  Pulse 88  Temp(Src) 98.5 F (36.9 C)  Resp 16  Ht 4\' 11"  (1.499 m)  Wt 122 lb (55.339 kg)  BMI 24.63 kg/m2  SpO2 100%  LMP 07/07/2013  Physical Exam  Nursing note and vitals reviewed. Constitutional: She is oriented to person, place, and time. She appears well-developed and well-nourished. No distress.  HENT:  Head: Normocephalic and atraumatic.  Right Ear: External ear normal.  Mouth/Throat: Oropharynx is clear and moist. No oropharyngeal exudate.  Positive cerumen impactions to the left ear  Eyes: Conjunctivae and EOM are normal. Pupils are equal, round, and reactive to  light. Right eye exhibits no discharge. Left eye exhibits no discharge.  Neck: Normal range of motion. Neck supple. No tracheal deviation present.  Negative neck stiffness Negative nuchal rigidity Negative cervical lymphadenopathy Negative meningeal signs  Cardiovascular: Normal rate, regular rhythm and normal heart sounds.  Exam reveals no friction rub.   No murmur heard. Pulses:      Radial pulses are 2+ on the right side, and 2+ on the left side.  Pulmonary/Chest: Effort normal and breath sounds normal. No respiratory distress. She has no wheezes. She has no rales. She exhibits no tenderness.   Negative respiratory distress Negative use of accessory muscles Patient stable to speak in full sentences without difficulty  Musculoskeletal: Normal range of motion.  Full ROM to upper and lower extremities without difficulty noted, negative ataxia noted  Lymphadenopathy:    She has no cervical adenopathy.  Neurological: She is oriented to person, place, and time. She exhibits normal muscle tone. Coordination normal.  Skin: Skin is warm and dry. No rash noted. She is not diaphoretic. No erythema.  Psychiatric: She has a normal mood and affect. Her behavior is normal. Thought content normal.    ED Course  Procedures (including critical care time)  DIAGNOSTIC STUDIES: Oxygen Saturation is 100% on RA, normal by my interpretation.    COORDINATION OF CARE: 6:00 PM- Discussed negative CXR findings. Will discharge with Robitussin. Pt advised of plan for treatment and pt agrees.  Dg Chest 2 View  08/02/2013   CLINICAL DATA:  Chest congestion, cough  EXAM: CHEST  2 VIEW  COMPARISON:  02/20/2012  FINDINGS: The heart size and mediastinal contours are within normal limits. Both lungs are clear. The visualized skeletal structures are unremarkable.  IMPRESSION: No active cardiopulmonary disease.   Electronically Signed   By: Elige Ko   On: 08/02/2013 17:20   Labs Review Labs Reviewed - No data to display Imaging Review Dg Chest 2 View  08/02/2013   CLINICAL DATA:  Chest congestion, cough  EXAM: CHEST  2 VIEW  COMPARISON:  02/20/2012  FINDINGS: The heart size and mediastinal contours are within normal limits. Both lungs are clear. The visualized skeletal structures are unremarkable.  IMPRESSION: No active cardiopulmonary disease.   Electronically Signed   By: Elige Ko   On: 08/02/2013 17:20    EKG Interpretation   None       MDM   1. URI, acute   2. Cough     Filed Vitals:   08/02/13 1634 08/02/13 1849  BP: 155/104 156/109  Pulse: 88 88  Temp: 98.5 F (36.9 C) 98 F (36.7  C)  TempSrc:  Oral  Resp: 16 16  Height: 4\' 11"  (1.499 m)   Weight: 122 lb (55.339 kg)   SpO2: 100% 98%   I personally performed the services described in this documentation, which was scribed in my presence. The recorded information has been reviewed and is accurate.  Patient presenting to emergency department with nasal congestion, cough that has been ongoing for the past 2 weeks. Alert and oriented. GCS 15. Heart rate and rhythm normal. Lungs clear to auscultation to upper and lower lobes bilaterally. Pulses palpable and strong, radial 2+ bilaterally. Negative pain upon palpation to frontal and maxillary sinuses. Negative neck stiffness, negative nuchal rigidity. Negative meningeal signs. Full range of motion to upper lower extremities bilaterally without difficulty noted. Chest x-ray negative for acute cardiopulmonary disease. PERC 0 - doubt PE. Suspicion to be upper respiratory infection, viral infection. Patient stable,  afebrile. Discharge patient. Discharged patient with cough syrup. Referred patient to primary care provider, urgent care Center. Discussed with patient to rest and stay hydrated. Discussed with patient to closely monitor symptoms and if symptoms are to worsen or change to report back to the ED - strict return instructions given.  Patient agreed to plan of care, understood, all questions answered.   Melanie MuttonMarissa Ronell Boldin, PA-C 08/03/13 [phone]

## 2013-08-04 NOTE — ED Provider Notes (Signed)
  Medical screening examination/treatment/procedure(s) were performed by non-physician practitioner and as supervising physician I was immediately available for consultation/collaboration.  EKG Interpretation   None          Gerhard Munchobert Beyla Loney, MD 08/04/13 [phone]

## 2013-08-05 ENCOUNTER — Emergency Department (HOSPITAL_COMMUNITY)
Admission: EM | Admit: 2013-08-05 | Discharge: 2013-08-05 | Disposition: A | Discharge status: Home / Self Care | Payer: No Typology Code available for payment source | Attending: Emergency Medicine | Admitting: Emergency Medicine

## 2013-08-05 ENCOUNTER — Encounter (HOSPITAL_COMMUNITY): Payer: Self-pay | Admitting: Emergency Medicine

## 2013-08-05 ENCOUNTER — Emergency Department (HOSPITAL_COMMUNITY): Payer: No Typology Code available for payment source

## 2013-08-05 DIAGNOSIS — R059 Cough, unspecified: Secondary | ICD-10-CM

## 2013-08-05 DIAGNOSIS — R05 Cough: Secondary | ICD-10-CM

## 2013-08-05 DIAGNOSIS — Z79899 Other long term (current) drug therapy: Secondary | ICD-10-CM | POA: Insufficient documentation

## 2013-08-05 DIAGNOSIS — I10 Essential (primary) hypertension: Secondary | ICD-10-CM | POA: Insufficient documentation

## 2013-08-05 DIAGNOSIS — J45901 Unspecified asthma with (acute) exacerbation: Secondary | ICD-10-CM | POA: Insufficient documentation

## 2013-08-05 LAB — D-DIMER, QUANTITATIVE: D-Dimer, Quant: 0.33 ug/mL-FEU (ref 0.00–0.48)

## 2013-08-05 MED ORDER — ALBUTEROL SULFATE HFA 108 (90 BASE) MCG/ACT IN AERS
2.0000 | INHALATION_SPRAY | Freq: Once | RESPIRATORY_TRACT | Status: AC
  Administered 2013-08-05: 2 via RESPIRATORY_TRACT
  Filled 2013-08-05: qty 6.7

## 2013-08-05 MED ORDER — HYDROCOD POLST-CHLORPHEN POLST 10-8 MG/5ML PO LQCR
5.0000 mL | Freq: Two times a day (BID) | ORAL | Status: DC | PRN
Start: 2013-08-05 — End: 2020-12-31

## 2013-08-05 NOTE — Discharge Instructions (Signed)
Follow-up with your doctor regarding your cough and blood pressure  Use inhaler 2 puffs every 4-6 hours as needed for cough  Return to the ED if you have any coughing up blood, shortness of breath, chest pain, difficulty breathing, leg swelling, fever, stiff neck, change/worsening condition, severe headache, or any other concerns (see below)  Try tussionex for cough - do not drive while taking this medication it can make your drowsy      Cough, Adult  A cough is a reflex that helps clear your throat and airways. It can help heal the body or may be a reaction to an irritated airway. A cough may only last 2 or 3 weeks (acute) or may last more than 8 weeks (chronic).  CAUSES Acute cough:  Viral or bacterial infections. Chronic cough:  Infections.  Allergies.  Asthma.  Post-nasal drip.  Smoking.  Heartburn or acid reflux.  Some medicines.  Chronic lung problems (COPD).  Cancer. SYMPTOMS   Cough.  Fever.  Chest pain.  Increased breathing rate.  High-pitched whistling sound when breathing (wheezing).  Colored mucus that you cough up (sputum). TREATMENT   A bacterial cough may be treated with antibiotic medicine.  A viral cough must run its course and will not respond to antibiotics.  Your caregiver may recommend other treatments if you have a chronic cough. HOME CARE INSTRUCTIONS   Only take over-the-counter or prescription medicines for pain, discomfort, or fever as directed by your caregiver. Use cough suppressants only as directed by your caregiver.  Use a cold steam vaporizer or humidifier in your bedroom or home to help loosen secretions.  Sleep in a semi-upright position if your cough is worse at night.  Rest as needed.  Stop smoking if you smoke. SEEK IMMEDIATE MEDICAL CARE IF:   You have pus in your sputum.  Your cough starts to worsen.  You cannot control your cough with suppressants and are losing sleep.  You begin coughing up blood.  You  have difficulty breathing.  You develop pain which is getting worse or is uncontrolled with medicine.  You have a fever. MAKE SURE YOU:   Understand these instructions.  Will watch your condition.  Will get help right away if you are not doing well or get worse. Document Released: 01/14/2011 Document Revised: 10/10/2011 Document Reviewed: 01/14/2011 [facility] Patient Information 2014 [place], [place].  Upper Respiratory Infection, Adult An upper respiratory infection (URI) is also known as the common cold. It is often caused by a type of germ (virus). Colds are easily spread (contagious). You can pass it to others by kissing, coughing, sneezing, or drinking out of the same glass. Usually, you get better in 1 or 2 weeks.  HOME CARE   Only take medicine as told by your doctor.  Use a warm mist humidifier or breathe in steam from a hot shower.  Drink enough water and fluids to keep your pee (urine) clear or pale yellow.  Get plenty of rest.  Return to work when your temperature is back to normal or as told by your doctor. You may use a face mask and wash your hands to stop your cold from spreading. GET HELP RIGHT AWAY IF:   After the first few days, you feel you are getting worse.  You have questions about your medicine.  You have chills, shortness of breath, or brown or red spit (mucus).  You have yellow or brown snot (nasal discharge) or pain in the face, especially when you bend forward.  You have a fever, puffy (swollen) neck, pain when you swallow, or white spots in the back of your throat.  You have a bad headache, ear pain, sinus pain, or chest pain.  You have a high-pitched whistling sound when you breathe in and out (wheezing).  You have a lasting cough or cough up blood.  You have sore muscles or a stiff neck. MAKE SURE YOU:   Understand these instructions.  Will watch your condition.  Will get help right away if you are not doing well or get worse. Document  Released: 01/04/2008 Document Revised: 10/10/2011 Document Reviewed: 11/22/2010 [facility] Patient Information 2014 [place], [place].  Hypertension As your heart beats, it forces blood through your arteries. This force is your blood pressure. If the pressure is too high, it is called hypertension (HTN) or high blood pressure. HTN is dangerous because you may have it and not know it. High blood pressure may mean that your heart has to work harder to pump blood. Your arteries may be narrow or stiff. The extra work puts you at risk for heart disease, stroke, and other problems.  Blood pressure consists of two numbers, a higher number over a lower, 110/72, for example. It is stated as "110 over 72." The ideal is below 120 for the top number (systolic) and under 80 for the bottom (diastolic). Write down your blood pressure today. You should pay close attention to your blood pressure if you have certain conditions such as:  Heart failure.  Prior heart attack.  Diabetes  Chronic kidney disease.  Prior stroke.  Multiple risk factors for heart disease. To see if you have HTN, your blood pressure should be measured while you are seated with your arm held at the level of the heart. It should be measured at least twice. A one-time elevated blood pressure reading (especially in the Emergency Department) does not mean that you need treatment. There may be conditions in which the blood pressure is different between your right and left arms. It is important to see your caregiver soon for a recheck. Most people have essential hypertension which means that there is not a specific cause. This type of high blood pressure may be lowered by changing lifestyle factors such as:  Stress.  Smoking.  Lack of exercise.  Excessive weight.  Drug/tobacco/alcohol use.  Eating less salt. Most people do not have symptoms from high blood pressure until it has caused damage to the body. Effective treatment can often  prevent, delay or reduce that damage. TREATMENT  When a cause has been identified, treatment for high blood pressure is directed at the cause. There are a large number of medications to treat HTN. These fall into several categories, and your caregiver will help you select the medicines that are best for you. Medications may have side effects. You should review side effects with your caregiver. If your blood pressure stays high after you have made lifestyle changes or started on medicines,   Your medication(s) may need to be changed.  Other problems may need to be addressed.  Be certain you understand your prescriptions, and know how and when to take your medicine.  Be sure to follow up with your caregiver within the time frame advised (usually within two weeks) to have your blood pressure rechecked and to review your medications.  If you are taking more than one medicine to lower your blood pressure, make sure you know how and at what times they should be taken. Taking two medicines at  the same time can result in blood pressure that is too low. SEEK IMMEDIATE MEDICAL CARE IF:  You develop a severe headache, blurred or changing vision, or confusion.  You have unusual weakness or numbness, or a faint feeling.  You have severe chest or abdominal pain, vomiting, or breathing problems. MAKE SURE YOU:   Understand these instructions.  Will watch your condition.  Will get help right away if you are not doing well or get worse. Document Released: 07/18/2005 Document Revised: 10/10/2011 Document Reviewed: 03/07/2008 [facility] Patient Information 2014 [place], [place].   Emergency Department Resource Guide 1) Find a Doctor and Pay Out of Pocket Although you won't have to find out who is covered by your insurance plan, it is a good idea to ask around and get recommendations. You will then need to call the office and see if the doctor you have chosen will accept you as a new patient and what  types of options they offer for patients who are self-pay. Some doctors offer discounts or will set up payment plans for their patients who do not have insurance, but you will need to ask so you aren't surprised when you get to your appointment.  2) Contact Your Local Health Department Not all health departments have doctors that can see patients for sick visits, but many do, so it is worth a call to see if yours does. If you don't know where your local health department is, you can check in your phone book. The CDC also has a tool to help you locate your state's health department, and many state websites also have listings of all of their local health departments.  3) Find a Walk-in Clinic If your illness is not likely to be very severe or complicated, you may want to try a walk in clinic. These are popping up all over the country in pharmacies, drugstores, and shopping centers. They're usually staffed by nurse practitioners or physician assistants that have been trained to treat common illnesses and complaints. They're usually fairly quick and inexpensive. However, if you have serious medical issues or chronic medical problems, these are probably not your best option.  No Primary Care Doctor: - Call Health Connect at  [phone] - they can help you locate a primary care doctor that  accepts your insurance, provides certain services, etc. - Physician Referral Service- [phone]  Chronic Pain Problems: Organization         Address  Phone   Notes  [facility]  [phone] Patients need to be referred by their primary care doctor.   Medication Assistance: Organization         Address  Phone   Notes  [facility] Med[facility] [address] [place], [place] [address] [phone] --Must be a resident of [facility] -- Must have NO insurance coverage whatsoever (no Medicaid/ Medicare, etc.) -- The pt. MUST have a primary care doctor that  directs their care regularly and follows them in the community   MedAssist  [phone]   [employer]  [phone]    Agencies that provide inexpensive medical care: Organization         Address  Phone   Notes  [facility]  [phone]   [facility]    [phone]   [facility] [address] [place], [place] [address] [phone]   [facility] [address] [place] [place] [phone])  [phone]   Planned Parenthood    [phone]) [phone]   [facility]    [phone](336) [phone]   [facility] and [facility]  [address], [place] Phone:  [phone], Fax:  [phone] Hours of Operation:  9 am - 6 pm, M-F.  Also accepts Medicaid/Medicare and self-pay.  [facility]  [address], [place] Phone: [phone](336) [phone], Fax: [phone]. Hours of Operation:  8:30 am - 5:30 pm, M-F.  Also accepts Medicaid and self-pay.  [facility] [place] [address], [place] Phone: [phone](336) [phone]   [facility] [address], [place], [place] [phone], Ext. [phone] Mondays & Thursdays: 7-9 AM.  First 15 patients are seen on a first come, first serve basis.    Medicaid-accepting [facility] Providers:  Organization         Address  Phone   Notes  [facility] [address], [place] [phone]) [phone] Also accepts self-pay patients.  [facility] [address], [place]  [phone]) [phone]   [facility] [address], [place] [phone]   [facility] [address] [place] [address], [place] [phone]) [phone]   Renaye RakersVeita Bland [address], [place]   [phone] Only accepts [place] Access [place] patients after they have their name applied to their card.   Self-Pay (no insurance) in [facility]:  Organization          Address  Phone   Notes  Sickle Cell Patients, [facility] [address], [place] [phone](336) [phone]   [facility] [address], [place] [phone]) [phone]   [facility]  [address], [place] [phone]   [facility]/Dr. Osei-Bonsu  [address] [place] [address] [place] [address], [place] or [phone], [place] [phone]) [phone] Phone number for both [place] and [place] locations is the same.  [facility] and [facility] [address], [place] [phone]) [phone]   [facility] [address] [place] [address], [place] or [address] [phone]) [phone]   [facility] [address], [place] [phone](336) [phone], phone; [phone], fax Sees patients 1st and 3rd Saturday of every month.  Must not qualify for public or private insurance (i.e. Medicaid, Medicare, [place] Health Choice, Veterans' Benefits)  Household income should be no more than 200% of the poverty level The clinic cannot treat you if you are pregnant or think you are pregnant  Sexually transmitted diseases are not treated at the clinic.    Dental Care: Organization         Address  Phone  Notes  [facility] of [facility] [address], [place] [phone]) [phone] Accepts children up to age 29 who are enrolled in [place] or [place] Health Choice; pregnant women with a Medicaid card; and children who have applied for Medicaid or [place] Health Choice, but were declined, whose parents can pay a reduced fee at time of service.  [facility] Department of [facility] [place]  [address], [place] [phone] Accepts children up to age 29 who are enrolled in [place] or [place] Health Choice; pregnant women with a Medicaid card; and children who have applied for Medicaid or  Health Choice, but  were declined, whose parents can pay a reduced fee at time of  service.  [facility]  [address] [place], [place] [phone] Patients are seen by appointment only. Walk-ins are not accepted. [facility] will see patients 75 years of age and older. Monday - Tuesday (8am-5pm) Most Wednesdays (8:30-5pm) $30 per visit, cash only  [facility] Adult Dental Access PROGRAM  [address], [facility] [phone] Patients are seen by appointment only. Walk-ins are not accepted. [facility] will see patients 12 years of age and older. One Wednesday Evening (Monthly: Volunteer Based).  $30 per visit, cash only  [employer]  [phone] for adults; Children under age 25, call Graduate Pediatric Dentistry at [phone]. Children aged 3-14, please call [phone] to request a pediatric application.  Dental services are provided in all areas of dental care including fillings, crowns and bridges, complete and partial dentures, implants, gum treatment, root canals, and extractions. Preventive care is also provided. Treatment is provided to both adults and children. Patients are selected via a lottery and there is often a waiting list.   [facility] [address], [place]  [phone] [email or web address]   [facility] [address] [place], [place] [phone], Ext. [phone] Second and Fourth Thursday of each month, opens at 6:30 AM; Clinic ends at 9 AM.  Patients are seen on a first-come first-served basis, and a limited number are seen during each clinic.   [facility]  [address] [place], [place] [phone]   Eligibility Requirements You must have lived in [place], [place], or [place] counties for at least the last three months.   You cannot be eligible for state or federal sponsored [place], including [employer], [place], or [employer].   You generally cannot be eligible for healthcare insurance through your employer.     How to apply: Eligibility screenings are held every Tuesday and Wednesday afternoon from 1:00 pm until 4:00 pm. You do not need an appointment for the interview!  [facility] [address] [place], [place] [phone]   [facility] Health Department  [phone]   [facility] Health Department  [phone]   [facility] Health Department  [phone]    Behavioral Health Resources in the Community: Intensive Outpatient Programs Organization         Address  Phone  Notes  [facility] [address] [place], [place] [phone]   [facility] Outpatient [address], [place], [place] [phone]   ADS: [facility] Drug Svcs [address] [place], [place]  [phone]   [facility] [address]  [place], [place] [phone] or [phone]   Substance Abuse Resources Organization         Address  Phone  Notes  Alcohol [facility] Drug Services  [phone]   [facility]  [phone]   [facility]  [phone]   Floydene Flock  [phone]   Residential & Outpatient Substance Abuse Program  [phone]   Psychological Services Organization         Address  Phone  Notes  [facility]  [phone]   [facility]  [phone]   [facility] [address] [place] [phone] or [phone]    Mobile Crisis Teams Organization         Address  Phone  Notes  Therapeutic  Alternatives, Mobile Crisis Care Unit  [phone]   [facility]  [address] [place], [place] [phone]   [facility] [address] [place] [phone]    Self-Help/Support Groups Organization         Address  Phone             Notes  [facility] [place] - variety of support groups  336- [phone] Call for more information  Narcotics Anonymous (NA), [facility]aring [facility] [address], [place]  2 meetings at this location   Statisticianesidential Treatment Programs Organization         Address  Phone  Notes  [facility] [address],    [place]  [phone]   [facility]  [address], [place] [phone], [place], [place] [phone]   [facility] [address], [place] [phone] Admissions: 8am-3pm M-F  [facility] [address]    [place], [place] [phone]   [facility] [address], [place], [place] [phone]   [facility] [address]  [place], [place] [phone]   [facility] - Intensive Outpatient [address], [place], [place] [phone]   [facility] ([facility]iction Recovery [facility] [address]  [place], [place] [phone] or [phone]   [facility] (RT[facility]) [address] [place], [place] [phone] Accepts Medicaid  [facility] [address]  [place] [phone] Substance Abuse/Addiction Treatment   [facility] Behavioral Health Resources Organization         Address  Phone  Notes  [facility]  [phone]) [phone]   Angie FavaJulie Brannon, PhD [address], [address] [place], [place]   [phone](336) [phone] or [phone](336) [phone]   [facility]   [address] [place], [place] [phone](336) [phone]   [facility] [address], [place], [place] [phone](336) [phone] Insurance/Medicaid/sponsorship through [facility]  Faith and Families [address]                                    [place], [place] [phone](336) [phone] Therapy/tele-psych/case  [facility] [address].   [place], [place] [phone]) [phone]    Dr. Lolly MustacheArfeen  [phone](336) [phone]   [facility] [place]  [employer] [facility] 1) [address], [place] 2) [address], [place] 3)  [address], [place] [phone](336) [phone](336) [phone]  [phone](336) [phone]   [facility] Child Abuse  Hotline [phone](336) [phone] or [phone](336) [phone] (After Hours)

## 2013-08-05 NOTE — ED Notes (Signed)
Pt here on 1/1 for URI. States she is no better today. Pt is in NO distress. Reports productive cough with yellow sputum with flecks of blood.

## 2013-08-05 NOTE — ED Notes (Signed)
Pt c/o URI with cough x 2 weeks; pt seen here for same; pt denies fever; pt sts small amount of blood at time in mucous

## 2013-08-05 NOTE — ED Provider Notes (Signed)
CSN: [id number]     Arrival date & time 08/05/13  1311 History  This chart was scribed for Coral Ceo, PA-C, working with Gilda Crease, * by Blanchard Kelch, ED Scribe. This patient was seen in room TR04C/TR04C and the patient's care was started at 3:42 PM.    Chief Complaint  Patient presents with  . Cough  . URI   Patient is a 28 y.o. female presenting with cough and URI. The history is provided by the patient. No language interpreter was used.  Cough Associated symptoms: chest pain and shortness of breath   Associated symptoms: no ear pain, no fever, no rash, no rhinorrhea and no sore throat   URI Presenting symptoms: congestion and cough   Presenting symptoms: no ear pain, no fever, no rhinorrhea and no sore throat   Associated symptoms: no neck pain     HPI Comments: Melanie Compton is a 28 y.o. female with a PMH of HTN who presents to the Emergency Department complaining of constant, productive cough with yellow, blood specked sputum that she states began two weeks ago.  She denies any gross hemoptysis.  She was seen in the ED on 08/02/13 for the same cough as was discharged with the instructions to follow up with a PCP. She states she has been using Nyquil as well as the prescribed Robitussin without relief. She returns today because her symptoms are not improving or worsening. She also reports intermittent shortness of breath and chest pain, only when coughing and nasal congestion. She states she has been prescribed an inhaler in the past for similar coughs with relief. She states she has some component of asthma, which are similar to her symptoms today.  She denies rhinorrhea, sore throat, ear pain, shortness of breath at rest, vomiting, diarrhea, abdominal pain, leg swelling, rash, neck pain or neck stiffness. She denies any recent travel. She states she has a history of a pulmonary embolism that occurred about four years ago but denies the current symptoms feel similar.      Past Medical History  Diagnosis Date  . Hypertension    History reviewed. No pertinent past surgical history. Family History  Problem Relation Age of Onset  . Hypertension Mother   . Hypertension Maternal Grandmother    History  Substance Use Topics  . Smoking status: Never Smoker   . Smokeless tobacco: Never Used  . Alcohol Use: No   OB History   Grav Para Term Preterm Abortions TAB SAB Ect Mult Living   0              Review of Systems  Constitutional: Negative for fever.  HENT: Positive for congestion. Negative for ear pain, rhinorrhea and sore throat.   Respiratory: Positive for cough and shortness of breath.   Cardiovascular: Positive for chest pain. Negative for leg swelling.  Gastrointestinal: Negative for vomiting, abdominal pain and diarrhea.  Musculoskeletal: Negative for neck pain and neck stiffness.  Skin: Negative for rash.  All other systems reviewed and are negative.   Allergies  Codeine  Home Medications   Current Outpatient Rx  Name  Route  Sig  Dispense  Refill  . albuterol (PROVENTIL HFA;VENTOLIN HFA) 108 (90 BASE) MCG/ACT inhaler   Inhalation   Inhale 1-2 puffs into the lungs every 6 (six) hours as needed for wheezing or shortness of breath.         . guaiFENesin (ROBITUSSIN) 100 MG/5ML liquid   Oral   Take 5-10 mLs (100-200 mg  total) by mouth every 4 (four) hours as needed for cough.   60 mL   0   . Pseudoeph-Doxylamine-DM-APAP (NYQUIL MULTI-SYMPTOM PO)   Oral   Take 15 mLs by mouth at bedtime as needed (cough).          Triage Vitals: BP 150/112  Pulse 86  Temp(Src) 98.1 F (36.7 C) (Oral)  Resp 18  SpO2 99%  LMP 07/07/2013  Filed Vitals:   08/05/13 1325 08/05/13 1604 08/05/13 1704  BP: 150/112 149/113 158/113  Pulse: 86 92 94  Temp: 98.1 F (36.7 C) 98.4 F (36.9 C)   TempSrc: Oral Oral   Resp: 18 22 20   SpO2: 99% 100% 100%    Physical Exam  Nursing note and vitals reviewed. Constitutional: She is oriented to  person, place, and time. She appears well-developed and well-nourished. No distress.  HENT:  Head: Normocephalic and atraumatic.  Right Ear: External ear normal.  Left Ear: External ear normal.  Nose: Nose normal.  Mouth/Throat: Oropharynx is clear and moist. No oropharyngeal exudate.  Tympanic membranes gray and translucent bilaterally with no erythema, edema, or hemotympanum. No erythema to the posterior pharynx.  No exudates.  No trismus. Uvula midline.    Eyes: Conjunctivae and EOM are normal. Right eye exhibits no discharge. Left eye exhibits no discharge.  Neck: Normal range of motion. Neck supple. No tracheal deviation present.  No cervical spinal or paraspinal tenderness to palpation throughout.  No limitations with neck ROM.  No LAD.    Cardiovascular: Normal rate, regular rhythm and normal heart sounds.  Exam reveals no gallop and no friction rub.   No murmur heard. Pulmonary/Chest: Effort normal and breath sounds normal. No respiratory distress. She has no wheezes. She has no rales. She exhibits tenderness.  Patient coughing throughout exam.  Tenderness to palpation to the mid-sternal chest  Abdominal: Soft. She exhibits no distension. There is no tenderness.  Musculoskeletal: Normal range of motion. She exhibits no edema and no tenderness.  No pedal edema or calf tenderness bilaterally.  Patient able to ambulate without difficulty or ataxia.    Neurological: She is alert and oriented to person, place, and time.  Skin: Skin is warm and dry. She is not diaphoretic.  Psychiatric: She has a normal mood and affect. Her behavior is normal.    ED Course  Procedures (including critical care time)  DIAGNOSTIC STUDIES: Oxygen Saturation is 99% on room air, normal by my interpretation.    COORDINATION OF CARE: 3:50 PM -Due to past history of PE, will order d-dimer to rule out. Patient verbalizes understanding and agrees with treatment plan.  Labs Review Labs Reviewed - No data to  display Imaging Review Dg Chest 2 View (if Patient Has Fever And/or Copd)  08/05/2013   CLINICAL DATA:  Congestion.  High blood pressure.  Nonsmoker.  EXAM: CHEST  2 VIEW  COMPARISON:  08/02/2013 and 02/20/2012.  FINDINGS: No infiltrate, congestive heart failure or pneumothorax.  Heart size within normal limits.  IMPRESSION: No active cardiopulmonary disease.   Electronically Signed   By: Bridgett LarssonSteve  Olson M.D.   On: 08/05/2013 13:56    EKG Interpretation   None       DG Chest 2 View (if patient has fever and/or COPD) (Final result)  Result time: 08/05/13 13:56:59    Final result by Rad Results In Interface (08/05/13 13:56:59)    Narrative:   CLINICAL DATA: Congestion. High blood pressure. Nonsmoker.  EXAM: CHEST 2 VIEW  COMPARISON: 08/02/2013 and  02/20/2012.  FINDINGS: No infiltrate, congestive heart failure or pneumothorax.  Heart size within normal limits.  IMPRESSION: No active cardiopulmonary disease.   Electronically Signed By: Bridgett Larsson M.D. On: 08/05/2013 13:56   Results for orders placed during the hospital encounter of 08/05/13  D-DIMER, QUANTITATIVE      Result Value Range   D-Dimer, Quant 0.33  0.00 - 0.48 ug/mL-FEU   MDM   Melanie Compton is a 28 y.o. female with a PMH of HTN who presents to the Emergency Department complaining of constant, productive cough with yellow, blood specked sputum that she states began two weeks ago.    Rechecks  5:03 PM = Pt states she is feeling much better after inhaler. Denies CP or SOB. Will follow up with a provider now that she has insurance. She has a h/o asthma, and has used inhalers in the past. She feels this episode may be related to her asthma. Has had hydrocodone in the past without any reactions. Has had BP medication in the past but has ran out, plans to follow up with PCP for new prescription.  Cannot remember what she was taking in the past.    Patient evaluated in the ED for an ongoing cough.  Cough possibly due  to URI vs bronchitis. Patient afebrile and non-toxic in appearance.  X-ray negative for an acute cardiopulmonary process.  Patient had relief in her symptoms with an albuterol inhaler which was provided to the patient in the ED.  She had no evidence of respiratory distress, hypoxia, or tachypnea.  D-dimer performed due to hx of PE, which was negative.  Low suspicion for PE.  Patient states symptoms today are similar to her asthma and not similar to her PE (with DVT) in the past.  No evidence of DVT today.  Patient has tried OTC cough medications without relief.  Prescribed Tussionex to help with symptoms. Patient had elevated BP on today's visit.  She has a hx of HTN but has not been taking her medications.   Patient instructed to follow-up with a PCP regarding her BP and cough. Return precautions, discharge instructions, and follow-up was discussed with the patient before discharge.     Discharge Medication List as of 08/05/2013  5:13 PM    START taking these medications   Details  chlorpheniramine-HYDROcodone (TUSSIONEX PENNKINETIC ER) 10-8 MG/5ML LQCR Take 5 mLs by mouth every 12 (twelve) hours as needed for cough (Cough)., Starting 08/05/2013, Until Discontinued, Print        Final impressions: 1. Cough   2. Hypertension      Luiz Iron PA-C   I personally performed the services described in this documentation, which was scribed in my presence. The recorded information has been reviewed and is accurate.        Jillyn Ledger, PA-C 08/06/13 1537

## 2013-08-09 NOTE — ED Provider Notes (Signed)
Medical screening examination/treatment/procedure(s) were performed by non-physician practitioner and as supervising physician I was immediately available for consultation/collaboration.  Gilda Creasehristopher J. Halle Davlin, MD 08/09/13 [phone]

## 2015-09-12 IMAGING — CR DG CHEST 2V
2 series · 2 of 2 positions shown · non-contrast
Comparison: 02/20/2012

CLINICAL DATA: Chest congestion, cough

EXAM:
CHEST  2 VIEW

[w chest pa]
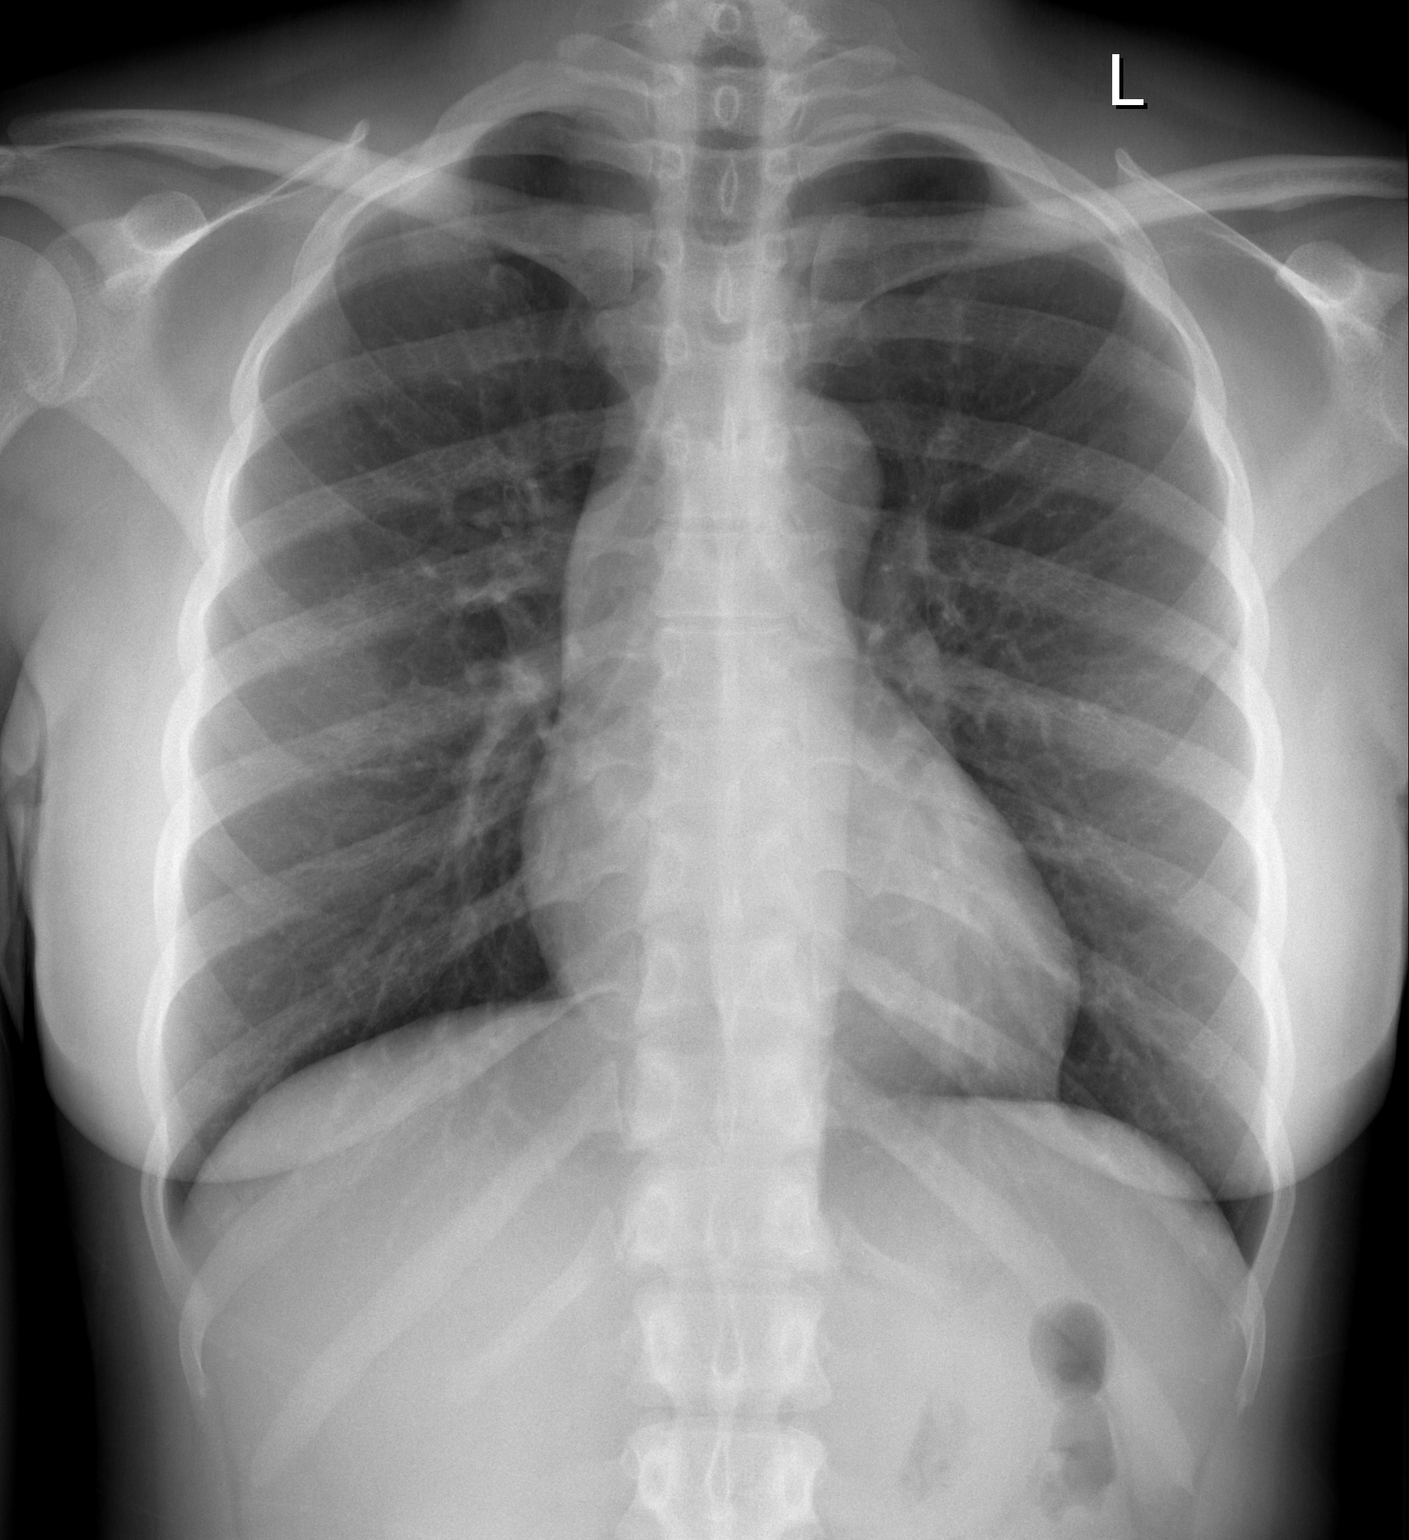

[w chest lat]
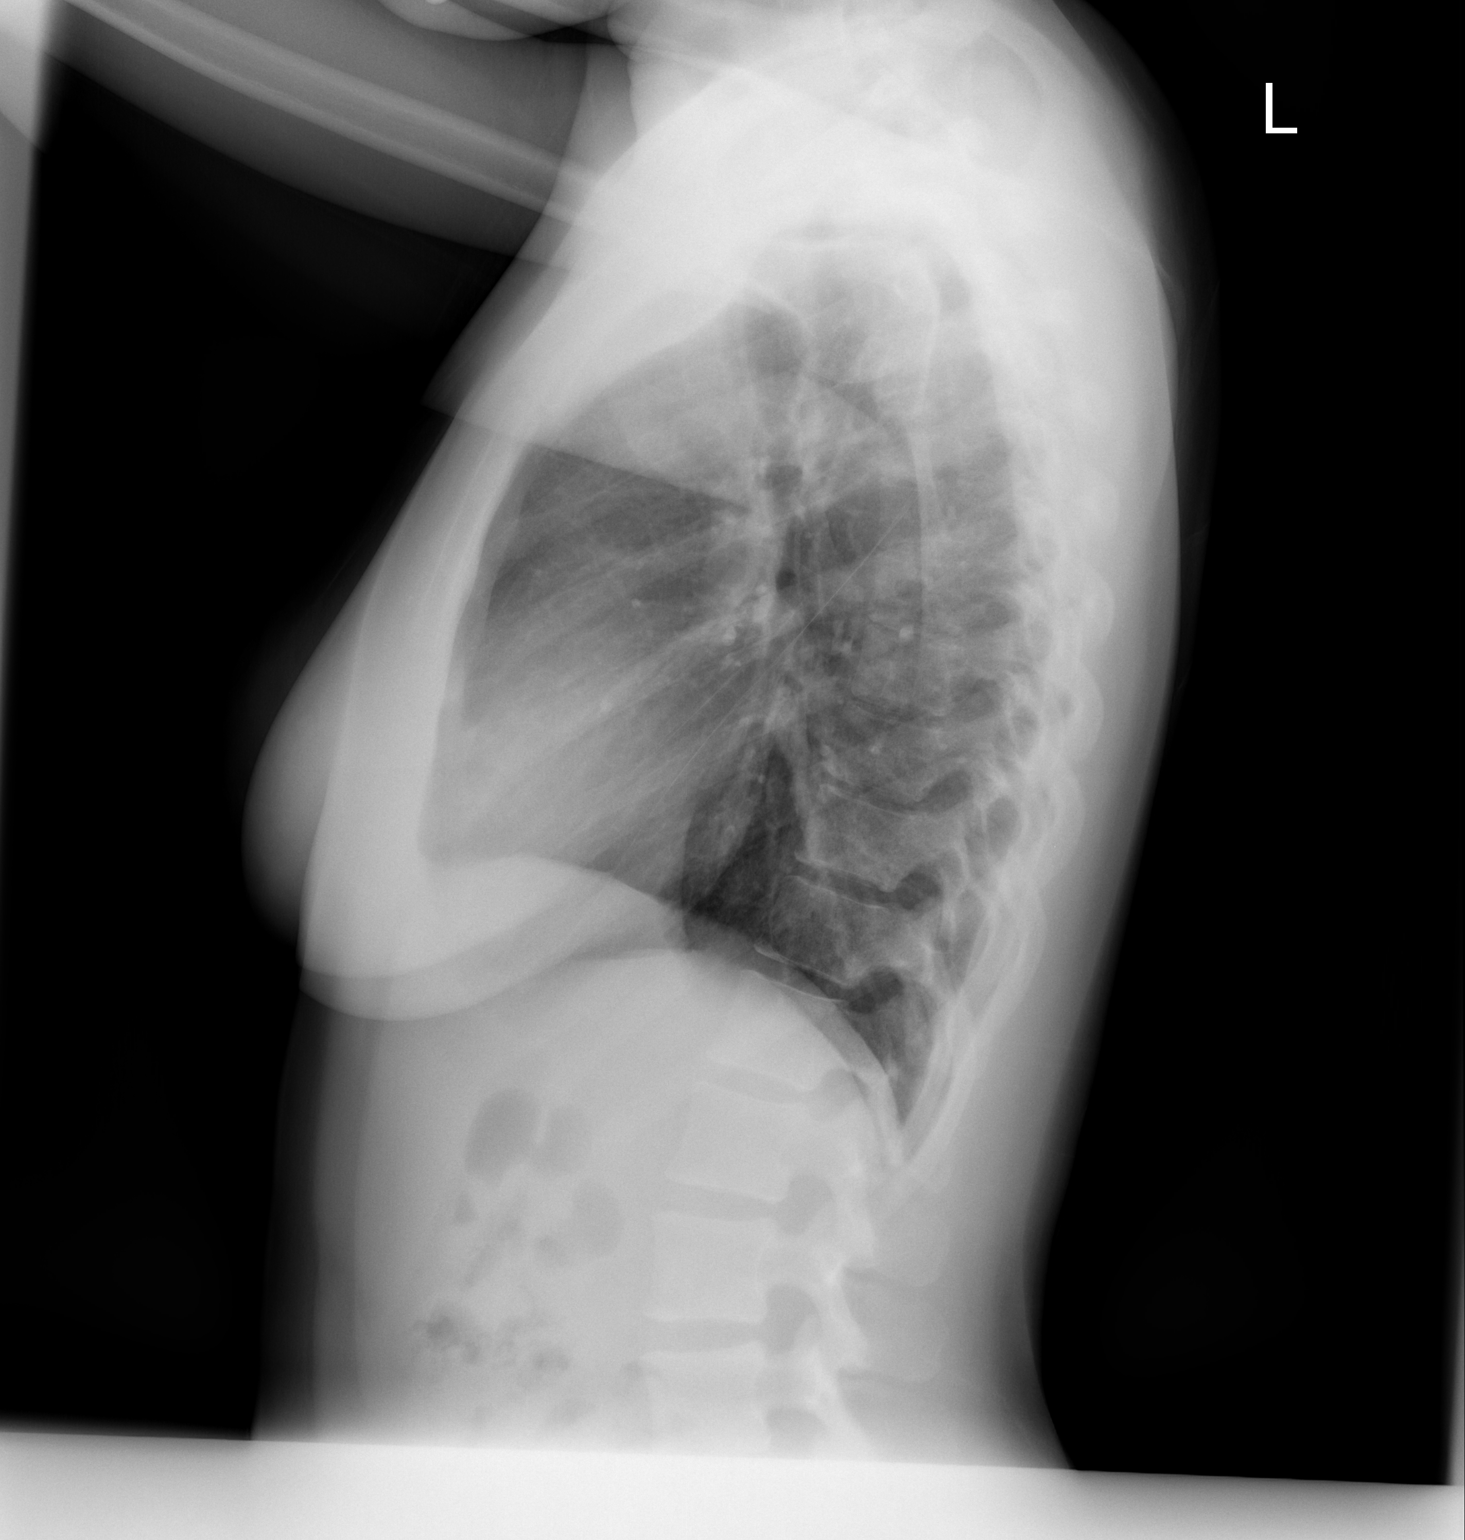

[2 of 2 positions shown; findings below may reference images not displayed]

FINDINGS: The heart size and mediastinal contours are within normal limits.
Both lungs are clear. The visualized skeletal structures are
unremarkable.
IMPRESSION: No active cardiopulmonary disease.

## 2015-09-15 IMAGING — CR DG CHEST 2V
2 series · 2 of 2 positions shown · non-contrast
Comparison: 08/02/2013 and 02/20/2012.

CLINICAL DATA: Congestion.  High blood pressure.  Nonsmoker.

EXAM:
CHEST  2 VIEW

[w chest pa]
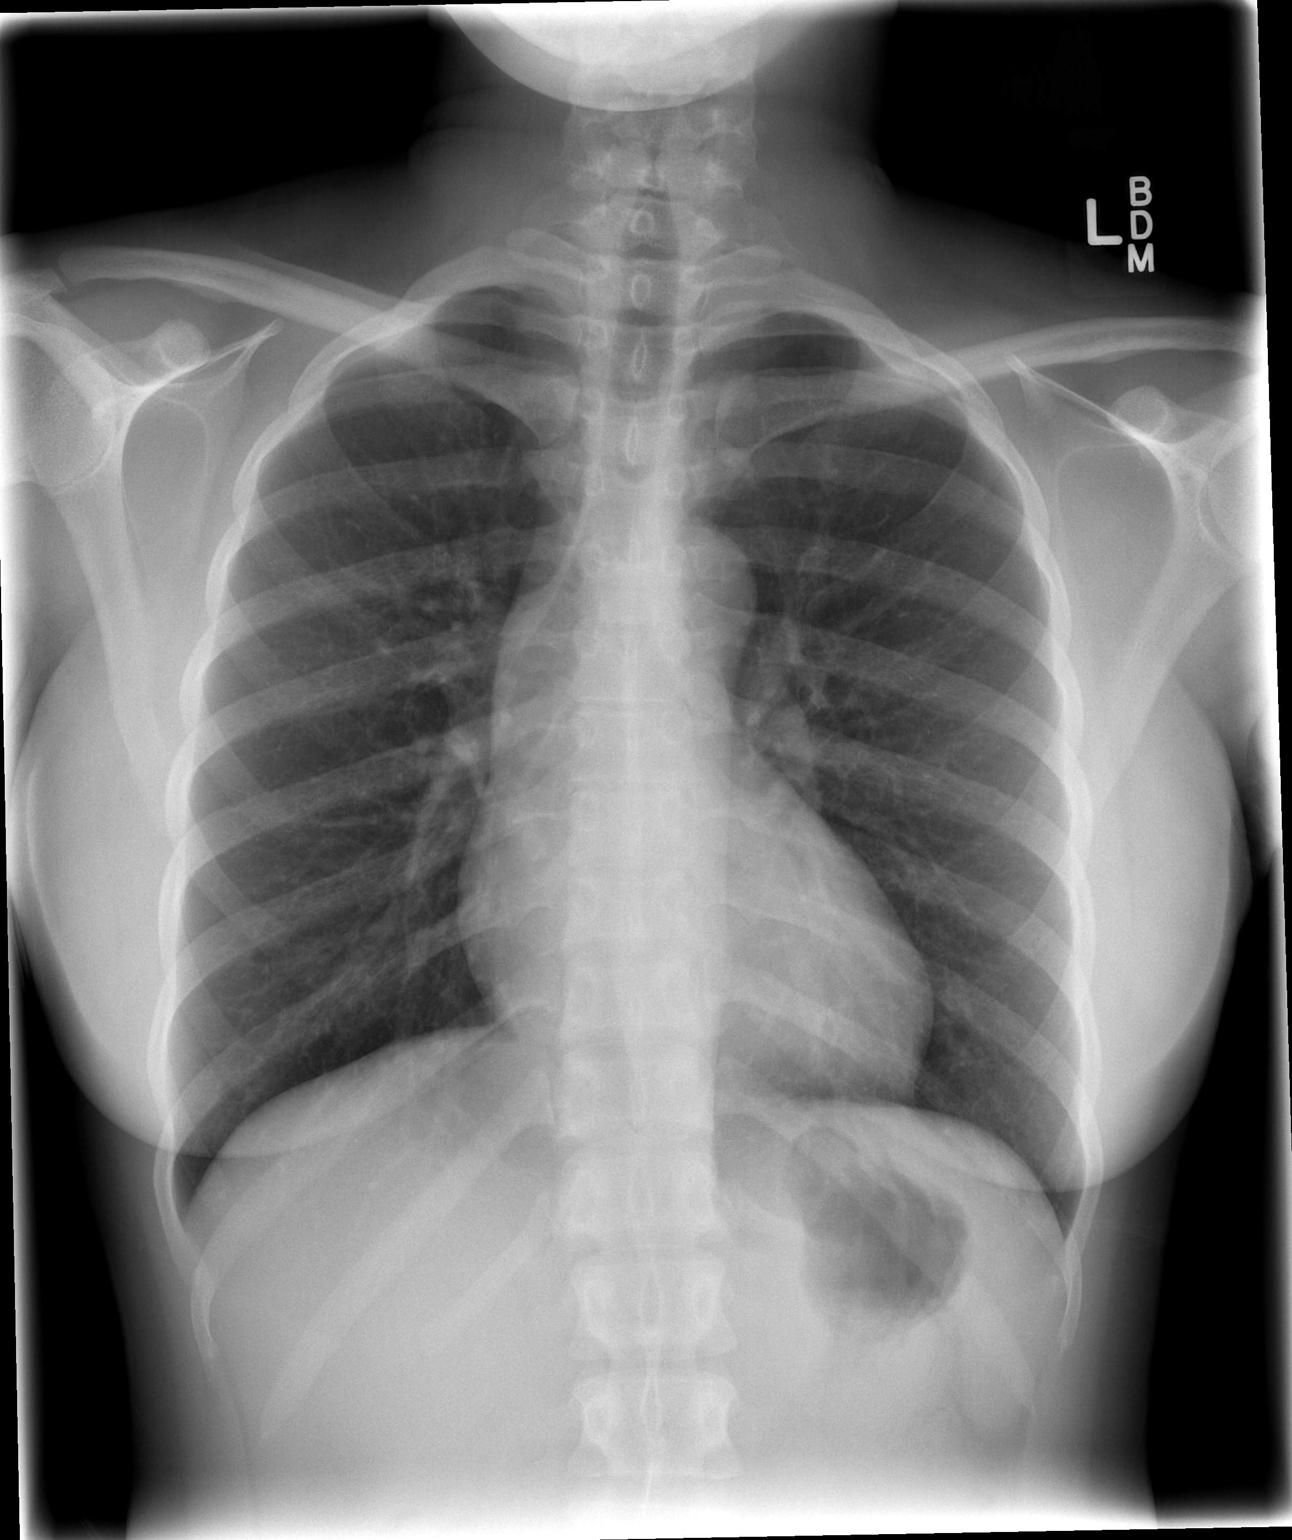

[w chest lat]
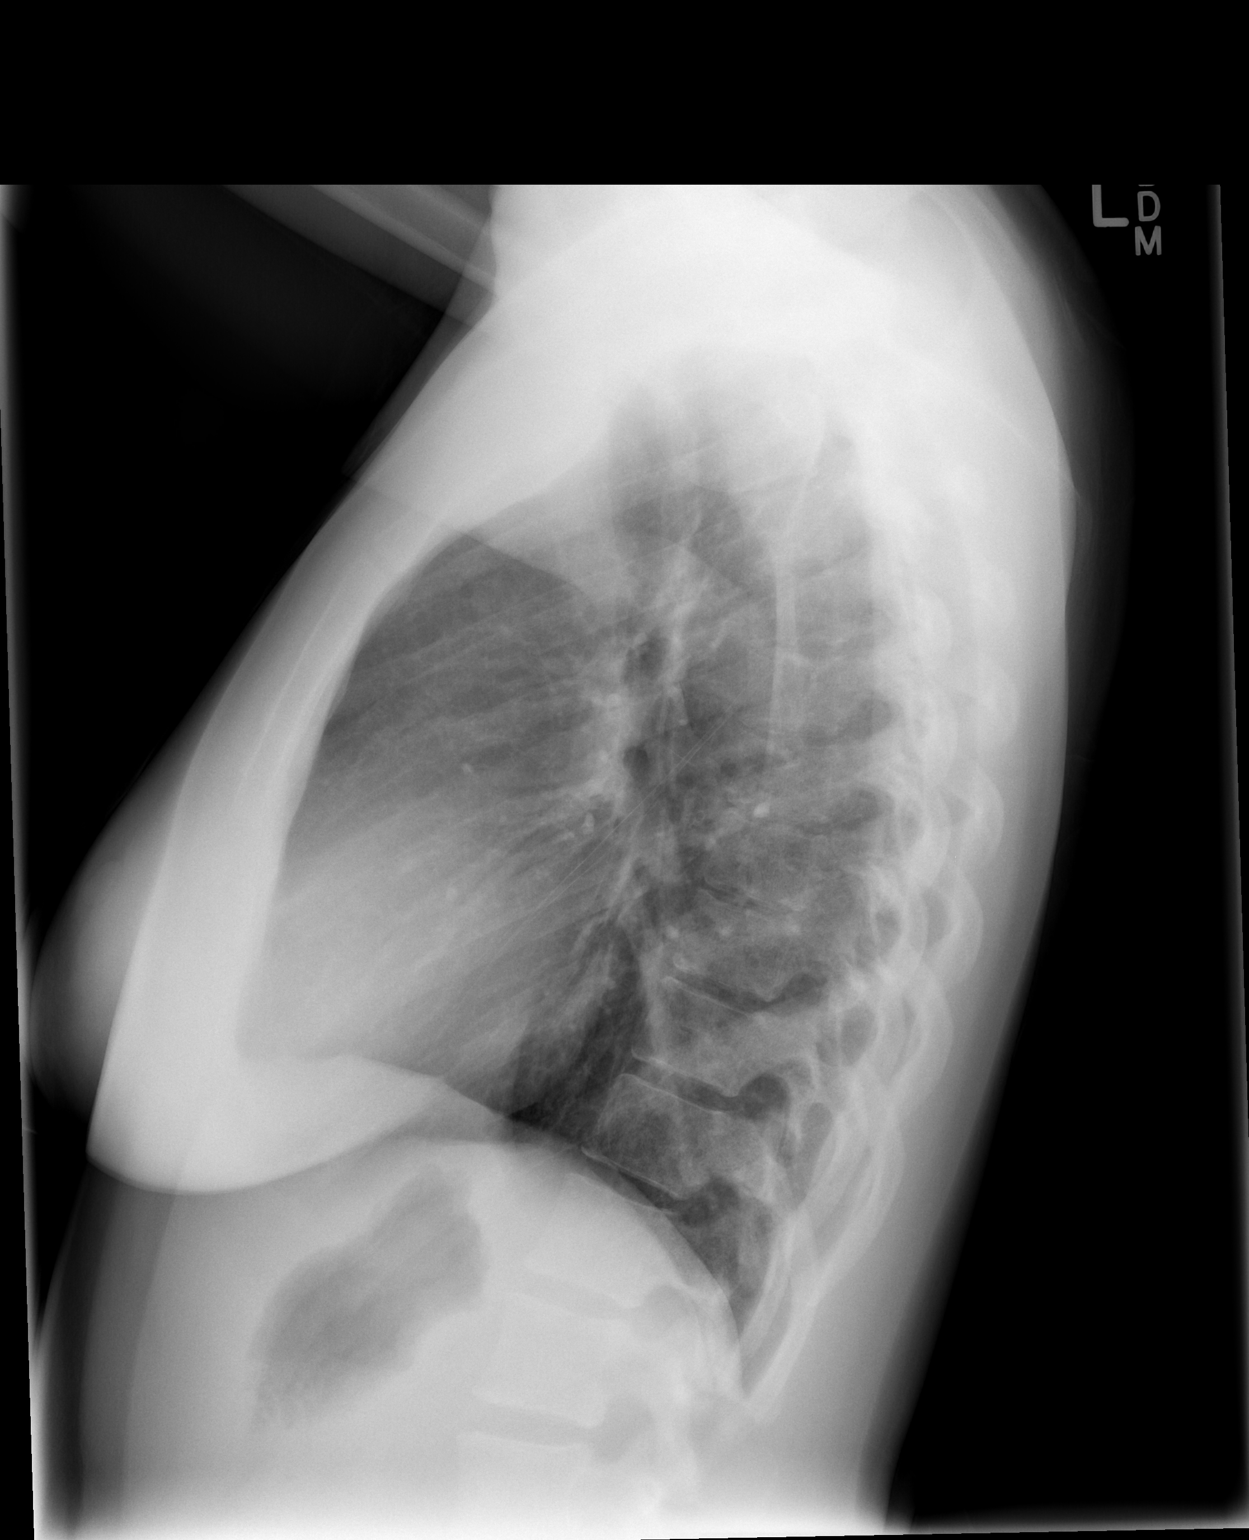

[2 of 2 positions shown; findings below may reference images not displayed]

FINDINGS: No infiltrate, congestive heart failure or pneumothorax.

Heart size within normal limits.
IMPRESSION: No active cardiopulmonary disease.

## 2020-12-31 ENCOUNTER — Other Ambulatory Visit: Payer: Self-pay

## 2020-12-31 ENCOUNTER — Encounter (HOSPITAL_BASED_OUTPATIENT_CLINIC_OR_DEPARTMENT_OTHER): Payer: Self-pay | Admitting: *Deleted

## 2020-12-31 ENCOUNTER — Emergency Department (HOSPITAL_BASED_OUTPATIENT_CLINIC_OR_DEPARTMENT_OTHER)
Admission: EM | Admit: 2020-12-31 | Discharge: 2020-12-31 | Disposition: A | Discharge status: Home / Self Care | Payer: BC Managed Care – PPO | Attending: Emergency Medicine | Admitting: Emergency Medicine

## 2020-12-31 DIAGNOSIS — I952 Hypotension due to drugs: Secondary | ICD-10-CM | POA: Insufficient documentation

## 2020-12-31 DIAGNOSIS — R42 Dizziness and giddiness: Secondary | ICD-10-CM

## 2020-12-31 DIAGNOSIS — I10 Essential (primary) hypertension: Secondary | ICD-10-CM | POA: Diagnosis not present

## 2020-12-31 LAB — CBC WITH DIFFERENTIAL/PLATELET
Abs Immature Granulocytes: 0.01 10*3/uL (ref 0.00–0.07)
Basophils Absolute: 0 10*3/uL (ref 0.0–0.1)
Basophils Relative: 1 %
Eosinophils Absolute: 0.1 10*3/uL (ref 0.0–0.5)
Eosinophils Relative: 1 %
HCT: 38.9 % (ref 36.0–46.0)
Hemoglobin: 12.8 g/dL (ref 12.0–15.0)
Immature Granulocytes: 0 %
Lymphocytes Relative: 42 %
Lymphs Abs: 2.6 10*3/uL (ref 0.7–4.0)
MCH: 29 pg (ref 26.0–34.0)
MCHC: 32.9 g/dL (ref 30.0–36.0)
MCV: 88.2 fL (ref 80.0–100.0)
Monocytes Absolute: 0.6 10*3/uL (ref 0.1–1.0)
Monocytes Relative: 9 %
Neutro Abs: 3 10*3/uL (ref 1.7–7.7)
Neutrophils Relative %: 47 %
Platelets: 342 10*3/uL (ref 150–400)
RBC: 4.41 MIL/uL (ref 3.87–5.11)
RDW: 13.4 % (ref 11.5–15.5)
WBC: 6.2 10*3/uL (ref 4.0–10.5)
nRBC: 0 % (ref 0.0–0.2)

## 2020-12-31 LAB — BASIC METABOLIC PANEL
Anion gap: 9 (ref 5–15)
BUN: 9 mg/dL (ref 6–20)
CO2: 23 mmol/L (ref 22–32)
Calcium: 8.7 mg/dL — ABNORMAL LOW (ref 8.9–10.3)
Chloride: 103 mmol/L (ref 98–111)
Creatinine, Ser: 0.76 mg/dL (ref 0.44–1.00)
GFR, Estimated: >60 mL/min (ref >60)
Glucose, Bld: 87 mg/dL (ref 70–99)
Potassium: 3.4 mmol/L — ABNORMAL LOW (ref 3.5–5.1)
Sodium: 135 mmol/L (ref 135–145)

## 2020-12-31 MED ORDER — AMLODIPINE BESYLATE 5 MG PO TABS: 5.0000 mg | ORAL_TABLET | Freq: Every day | ORAL | 0 refills | Status: AC

## 2020-12-31 MED ORDER — AMLODIPINE BESYLATE 5 MG PO TABS
5.0000 mg | ORAL_TABLET | Freq: Once | ORAL | Status: AC
  Administered 2020-12-31: 5 mg via ORAL
  Filled 2020-12-31: qty 1

## 2020-12-31 NOTE — ED Triage Notes (Signed)
C/o dizziness and " high headed" x 3 days

## 2020-12-31 NOTE — ED Provider Notes (Signed)
[facility] EMERGENCY DEPARTMENT Provider Note   CSN: [id number] Arrival date & time: 12/31/20  1321     History Chief Complaint  Patient presents with  . Dizziness    Melanie Compton is a 35 y.o. female with hx of HTN presents with lightheadedness when sitting up or standing up over the past 3 days. Denies any other symptoms. No history of similar problems. She does note decreased PO intake because she has been busy at work. Did not have breakfast or lunch today. 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 Has had elevated blood pressure, reportedly 150s systolic at home. Hx of HTN and previously on lisinopril, at last office visit with her PCP had BP of 138/82 and plan was to reduce salt intake and maintain exercises. She does endorse heavy periods.   Past Medical History:  Diagnosis Date  . Hypertension     Patient Active Problem List   Diagnosis Date Noted  . Family history of hypertension 12/16/2011    History reviewed. No pertinent surgical history.   OB History    Gravida  0   Para      Term      Preterm      AB      Living        SAB      IAB      Ectopic      Multiple      Live Births              Family History  Problem Relation Age of Onset  . Hypertension Mother   . Hypertension Maternal Grandmother     Social History   Tobacco Use  . Smoking status: Never Smoker  . Smokeless tobacco: Never Used  Substance Use Topics  . Alcohol use: No  . Drug use: No    Home Medications Prior to Admission medications   Medication Sig Start Date End Date Taking? Authorizing Provider  albuterol (PROVENTIL HFA;VENTOLIN HFA) 108 (90 BASE) MCG/ACT inhaler Inhale 1-2 puffs into the lungs every 6 (six) hours as needed for wheezing or shortness of breath.    [provider]    Allergies    Codeine  Review of Systems   Review of Systems  Constitutional: Negative for chills, fatigue and  fever.  HENT: Negative for congestion and sore throat.   Respiratory: Negative for cough and shortness of breath.   Cardiovascular: Negative for chest pain, palpitations and leg swelling.  Gastrointestinal: Negative for abdominal pain, nausea and vomiting.  Neurological: Positive for light-headedness. Negative for syncope and weakness.  All other systems reviewed and are negative.   Physical Exam Updated Vital Signs BP (!) 175/130 (BP Location: Left Arm)   Pulse 78   Temp 98.6 F (37 C) (Oral)   Resp 18   Ht 5' (1.524 m)   Wt 52.2 kg   LMP 12/18/2020   SpO2 100%   BMI 22.46 kg/m   Physical Exam Vitals and nursing note reviewed.  Constitutional:      Appearance: Normal appearance.  HENT:     Head: Normocephalic and atraumatic.     Right Ear: External ear normal.     Left Ear: External ear normal.     Nose: Nose normal.     Mouth/Throat:     Mouth: Mucous membranes are moist.  Eyes:     Extraocular Movements: Extraocular movements intact.  Cardiovascular:  Rate and Rhythm: Normal rate and regular rhythm.     Pulses: Normal pulses.     Heart sounds: Normal heart sounds. No murmur heard. No friction rub. No gallop.   Pulmonary:     Effort: Pulmonary effort is normal. No respiratory distress.     Breath sounds: No wheezing, rhonchi or rales.  Abdominal:     General: Abdomen is flat.  Musculoskeletal:        General: No swelling or deformity. Normal range of motion.     Cervical back: Normal range of motion and neck supple.  Skin:    General: Skin is warm and dry.  Neurological:     General: No focal deficit present.     Mental Status: She is alert and oriented to person, place, and time.     Cranial Nerves: No cranial nerve deficit.     Sensory: No sensory deficit.     Motor: No weakness.     Coordination: Coordination normal.  Psychiatric:        Mood and Affect: Mood normal.        Behavior: Behavior normal.     ED Results / Procedures / Treatments    Labs (all labs ordered are listed, but only abnormal results are displayed) Labs Reviewed  BASIC METABOLIC PANEL  CBC WITH DIFFERENTIAL/PLATELET    EKG EKG Interpretation  Date/Time:  Thursday December 31 2020 13:35:16 EDT Ventricular Rate:  93 PR Interval:  143 QRS Duration: 84 QT Interval:  340 QTC Calculation: 423 R Axis:   34 Text Interpretation: Sinus rhythm Atrial premature complex Consider left ventricular hypertrophy No significant change since last tracing Confirmed by Frederick Peers [phone]) on 12/31/2020 2:27:34 PM   Radiology No results found.  Procedures Procedures   Medications Ordered in ED Medications  amLODipine (NORVASC) tablet 5 mg (has no administration in time range)    ED Course  I have reviewed the triage vital signs and the nursing notes.  Pertinent labs & imaging results that were available during my care of the patient were reviewed by me and considered in my medical decision making (see chart for details).    MDM Rules/Calculators/A&P                          Patient with orthostatic lightheadedness. BP noted to be elevated, initially 182/130 > 164/118. Normal neurologic exam. Differential includes dehydration from poor PO intake, anemia from heavy periods, hypertension-associated.   Orthostatics mostly reassuring except for increase in heart rate from 74 > 90 from lying > sitting. No drop in blood pressure with positional chaages. I do not believe she is significantly volume down. Given diet.  Signed out to Dr. Clarene Duke pending results of BMP.   Final Clinical Impression(s) / ED Diagnoses Orthostatic hypotension from poor PO intake  Rx / DC Orders ED Discharge Orders    None       Remo Lipps, MD 12/31/20 1456    Little, Ambrose Finland, MD 01/03/21 [phone]

## 2020-12-31 NOTE — ED Notes (Signed)
ED Provider at bedside.
